# Patient Record
Sex: Male | Born: 1965 | Race: White | Hispanic: No | Marital: Single | Smoking: Never smoker
Health system: Southern US, Community
[De-identification: ages and names within clinical notes are randomized; demographics above are authoritative.]

## PROBLEM LIST (undated history)

## (undated) DIAGNOSIS — I10 Essential (primary) hypertension: Secondary | ICD-10-CM

## (undated) DIAGNOSIS — K4090 Unilateral inguinal hernia, without obstruction or gangrene, not specified as recurrent: Secondary | ICD-10-CM

## (undated) DIAGNOSIS — R7303 Prediabetes: Secondary | ICD-10-CM

## (undated) DIAGNOSIS — K5790 Diverticulosis of intestine, part unspecified, without perforation or abscess without bleeding: Secondary | ICD-10-CM

## (undated) HISTORY — PX: BACK SURGERY: SHX140

---

## 2005-12-03 ENCOUNTER — Emergency Department (HOSPITAL_COMMUNITY): Admission: EM | Admit: 2005-12-03 | Discharge: 2005-12-03 | Payer: Self-pay | Admitting: Emergency Medicine

## 2005-12-16 ENCOUNTER — Ambulatory Visit: Payer: Self-pay | Admitting: Internal Medicine

## 2008-01-09 ENCOUNTER — Ambulatory Visit: Payer: Self-pay | Admitting: Internal Medicine

## 2009-03-13 ENCOUNTER — Ambulatory Visit: Payer: Self-pay | Admitting: Gastroenterology

## 2012-04-15 ENCOUNTER — Ambulatory Visit: Payer: Self-pay | Admitting: Internal Medicine

## 2014-11-28 ENCOUNTER — Emergency Department: Payer: BLUE CROSS/BLUE SHIELD

## 2014-11-28 ENCOUNTER — Encounter: Payer: Self-pay | Admitting: Emergency Medicine

## 2014-11-28 ENCOUNTER — Ambulatory Visit (INDEPENDENT_AMBULATORY_CARE_PROVIDER_SITE_OTHER)
Admission: EM | Admit: 2014-11-28 | Discharge: 2014-11-28 | Disposition: A | Discharge status: Home / Self Care | Payer: BLUE CROSS/BLUE SHIELD | Source: Home / Self Care | Attending: Family Medicine | Admitting: Family Medicine

## 2014-11-28 ENCOUNTER — Emergency Department
Admission: EM | Admit: 2014-11-28 | Discharge: 2014-11-28 | Disposition: A | Discharge status: Home / Self Care | Payer: BLUE CROSS/BLUE SHIELD | Attending: Emergency Medicine | Admitting: Emergency Medicine

## 2014-11-28 DIAGNOSIS — N289 Disorder of kidney and ureter, unspecified: Secondary | ICD-10-CM

## 2014-11-28 DIAGNOSIS — R109 Unspecified abdominal pain: Secondary | ICD-10-CM

## 2014-11-28 DIAGNOSIS — I10 Essential (primary) hypertension: Secondary | ICD-10-CM | POA: Diagnosis not present

## 2014-11-28 DIAGNOSIS — K5732 Diverticulitis of large intestine without perforation or abscess without bleeding: Secondary | ICD-10-CM | POA: Insufficient documentation

## 2014-11-28 DIAGNOSIS — R1032 Left lower quadrant pain: Secondary | ICD-10-CM | POA: Diagnosis present

## 2014-11-28 DIAGNOSIS — Z880 Allergy status to penicillin: Secondary | ICD-10-CM | POA: Insufficient documentation

## 2014-11-28 DIAGNOSIS — N200 Calculus of kidney: Secondary | ICD-10-CM | POA: Diagnosis not present

## 2014-11-28 HISTORY — DX: Essential (primary) hypertension: I10

## 2014-11-28 HISTORY — DX: Diverticulosis of intestine, part unspecified, without perforation or abscess without bleeding: K57.90

## 2014-11-28 LAB — COMPREHENSIVE METABOLIC PANEL
ALT: 19 U/L (ref 17–63)
AST: 19 U/L (ref 15–41)
Albumin: 4.3 g/dL (ref 3.5–5.0)
Alkaline Phosphatase: 72 U/L (ref 38–126)
Anion gap: 10 (ref 5–15)
BUN: 34 mg/dL — ABNORMAL HIGH (ref 6–20)
CO2: 31 mmol/L (ref 22–32)
Calcium: 9.9 mg/dL (ref 8.9–10.3)
Chloride: 95 mmol/L — ABNORMAL LOW (ref 101–111)
Creatinine, Ser: 1.93 mg/dL — ABNORMAL HIGH (ref 0.61–1.24)
GFR calc Af Amer: 46 mL/min — ABNORMAL LOW (ref >60)
GFR calc non Af Amer: 40 mL/min — ABNORMAL LOW (ref >60)
Glucose, Bld: 109 mg/dL — ABNORMAL HIGH (ref 65–99)
Potassium: 5.3 mmol/L — ABNORMAL HIGH (ref 3.5–5.1)
Sodium: 136 mmol/L (ref 135–145)
Total Bilirubin: 1.8 mg/dL — ABNORMAL HIGH (ref 0.3–1.2)
Total Protein: 8.2 g/dL — ABNORMAL HIGH (ref 6.5–8.1)

## 2014-11-28 LAB — CBC WITH DIFFERENTIAL/PLATELET
Basophils Absolute: 0.1 10*3/uL (ref 0–0.1)
Basophils Relative: 1 %
Eosinophils Absolute: 0.2 10*3/uL (ref 0–0.7)
Eosinophils Relative: 2 %
HCT: 46.4 % (ref 40.0–52.0)
Hemoglobin: 15.9 g/dL (ref 13.0–18.0)
Lymphocytes Relative: 15 %
Lymphs Abs: 1.6 10*3/uL (ref 1.0–3.6)
MCH: 29.2 pg (ref 26.0–34.0)
MCHC: 34.3 g/dL (ref 32.0–36.0)
MCV: 85.3 fL (ref 80.0–100.0)
Monocytes Absolute: 1.4 10*3/uL — ABNORMAL HIGH (ref 0.2–1.0)
Monocytes Relative: 13 %
Neutro Abs: 7.6 10*3/uL — ABNORMAL HIGH (ref 1.4–6.5)
Neutrophils Relative %: 69 %
Platelets: 244 10*3/uL (ref 150–440)
RBC: 5.44 MIL/uL (ref 4.40–5.90)
RDW: 15.4 % — ABNORMAL HIGH (ref 11.5–14.5)
WBC: 10.9 10*3/uL — ABNORMAL HIGH (ref 3.8–10.6)

## 2014-11-28 LAB — URINALYSIS COMPLETE WITH MICROSCOPIC (ARMC ONLY)
Glucose, UA: NEGATIVE mg/dL
Hgb urine dipstick: NEGATIVE
Leukocytes, UA: NEGATIVE
Nitrite: POSITIVE — AB
Protein, ur: 30 mg/dL — AB
Specific Gravity, Urine: 1.03 (ref 1.005–1.030)
pH: 5 (ref 5.0–8.0)

## 2014-11-28 MED ORDER — SODIUM CHLORIDE 0.9 % IV BOLUS (SEPSIS)
1000.0000 mL | Freq: Once | INTRAVENOUS | Status: AC
  Administered 2014-11-28: 1000 mL via INTRAVENOUS

## 2014-11-28 MED ORDER — METRONIDAZOLE 500 MG PO TABS
500.0000 mg | ORAL_TABLET | Freq: Once | ORAL | Status: AC
  Administered 2014-11-28: 500 mg via ORAL

## 2014-11-28 MED ORDER — METRONIDAZOLE 500 MG PO TABS
ORAL_TABLET | ORAL | Status: AC
  Administered 2014-11-28: 500 mg via ORAL
  Filled 2014-11-28: qty 1

## 2014-11-28 MED ORDER — METRONIDAZOLE 500 MG PO TABS: 500.0000 mg | ORAL_TABLET | Freq: Two times a day (BID) | ORAL | Status: DC

## 2014-11-28 MED ORDER — CIPROFLOXACIN IN D5W 400 MG/200ML IV SOLN
400.0000 mg | Freq: Once | INTRAVENOUS | Status: AC
  Administered 2014-11-28: 400 mg via INTRAVENOUS

## 2014-11-28 MED ORDER — CIPROFLOXACIN HCL 500 MG PO TABS: 500.0000 mg | ORAL_TABLET | Freq: Two times a day (BID) | ORAL | Status: AC

## 2014-11-28 MED ORDER — OXYCODONE-ACETAMINOPHEN 5-325 MG PO TABS: 1.0000 | ORAL_TABLET | Freq: Four times a day (QID) | ORAL | Status: DC | PRN

## 2014-11-28 MED ORDER — CIPROFLOXACIN IN D5W 400 MG/200ML IV SOLN
INTRAVENOUS | Status: AC
  Administered 2014-11-28: 400 mg via INTRAVENOUS
  Filled 2014-11-28: qty 200

## 2014-11-28 NOTE — ED Provider Notes (Signed)
[facility] Emergency Department Provider Note  ____________________________________________  Time seen: 1805  I have reviewed the triage vital signs and the nursing notes.   HISTORY  Chief Complaint Abdominal Pain     HPI Curtis Bonilla is a 49 y.o. male who is been experiencing pain in his left abdomen since this past Sunday. He does have a history of diverticulitis in the past. He had some leftover ciprofloxacin in the house and he began taking that on Sunday. He is not feeling any relief 3 days later.  With this pain in his left lower quadrant, he went to med and urgent care today. Blood tests and urinalysis was reviewed. CT scan was not available at the [facility] urgent care and the patient has been sent to the emergency department for further evaluation.  Patient reports that his pain has been moderate or more severe at times. It hurts more when he is being jarred, as in when his car had some bump or if he steps down hard. He has not been tolerating food for a well and has eaten very little over the past 3-4 days. He reports his urine has been a bright RN she color. He has been drinking plenty of fluids, but despite this he is not urinating much here in the emergency department after 1 L via the IV and 1 L of oral contrast.    Past Medical History  Diagnosis Date  . Hypertension   . Diverticular disease     There are no active problems to display for this patient.   Past Surgical History  Procedure Laterality Date  . Back surgery      Current Outpatient Rx  Name  Route  Sig  Dispense  Refill  . ciprofloxacin (CIPRO) 500 MG tablet   Oral   Take 1 tablet (500 mg total) by mouth 2 (two) times daily.   14 tablet   0   . metroNIDAZOLE (FLAGYL) 500 MG tablet   Oral   Take 1 tablet (500 mg total) by mouth 2 (two) times daily.   14 tablet   0   . oxyCODONE-acetaminophen (ROXICET) 5-325 MG per tablet   Oral   Take 1 tablet by mouth every 6  (six) hours as needed.   12 tablet   0     Allergies Penicillins  No family history on file.  Social History History  Substance Use Topics  . Smoking status: Never Smoker   . Smokeless tobacco: Not on file  . Alcohol Use: Yes    Review of Systems  Constitutional: Negative for fever. ENT: Negative for sore throat. Cardiovascular: Negative for chest pain. Respiratory: Negative for shortness of breath. Gastrointestinal: Abdominal pain. See history of present illness. Genitourinary: Negative for dysuria. Musculoskeletal: No myalgias or injuries. Skin: Negative for rash. Neurological: Negative for headaches   10-point ROS otherwise negative.  ____________________________________________   PHYSICAL EXAM:  VITAL SIGNS: ED Triage Vitals  Enc Vitals Group     BP 11/28/14 1612 119/81 mmHg     Pulse Rate 11/28/14 1612 101     Resp 11/28/14 1612 18     Temp 11/28/14 1612 98.4 F (36.9 C)     Temp Source 11/28/14 1612 Oral     SpO2 11/28/14 1612 100 %     Weight 11/28/14 1612 270 lb (122.471 kg)     Height 11/28/14 1612 6' (1.829 m)     Head Cir --      Peak Flow --  Pain Score 11/28/14 1613 6     Pain Loc --      Pain Edu? --      Excl. in GC? --     Constitutional: Alert and oriented. Patient appears a little uncomfortable due to abdominal pain but otherwise no acute distress.Marland Kitchen. ENT   Head: Normocephalic and atraumatic.   Nose: No congestion/rhinnorhea.   Mouth/Throat: Mucous membranes are moist. Cardiovascular: Normal rate, regular rhythm, no murmur noted Respiratory:  Normal respiratory effort, no tachypnea.    Breath sounds are clear and equal bilaterally.  Gastrointestinal: Soft. Patient has tenderness in the left lower quadrant. He also has mild tenderness noted in the right abdomen that refers to the left. He has minimal left CVA tenderness. "I can feel that".  Back: No muscle spasm, no tenderness Musculoskeletal: No deformity noted.  Nontender with normal range of motion in all extremities.  No noted edema. Neurologic:  Normal speech and language. No gross focal neurologic deficits are appreciated.  Skin:  Skin is warm, dry. No rash noted. Psychiatric: Mood and affect are normal. Speech and behavior are normal.  ____________________________________________    LABS (pertinent positives/negatives)  Labs from med urgent care earlier today have been reviewed. He has a normal white blood cell count, mild elevation in BUN and mild elevation and potassium at 5.3.  Patient has normal LFTs, but slight elevation in bilirubin at 1.8.  Urinalysis shows 6-30 red blood cells with no white blood cells but positive for nitrites. 1+ ketones and 3+ bilirubin ____________________________________________  RADIOLOGY  CT scan abdomen pelvis: IMPRESSION: 1. Moderate recurrent uncomplicated descending/sigmoid diverticulitis. No evidence of abscess or obstruction. 2. Hepatic steatosis.  ____________________________________________ ____________________________________________   INITIAL IMPRESSION / ASSESSMENT AND PLAN / ED COURSE  Pertinent labs & imaging results that were available during my care of the patient were reviewed by me and considered in my medical decision making (see chart for details).  Patient with signs and symptoms consistent with diverticulitis. He has been on Cipro for the past 3 days without benefit. CT scan results are pending.  ----------------------------------------- 6:28 PM on 11/28/2014 -----------------------------------------  CT results show patient does have recurrent, uncomplicated, descending and sigmoid diverticulitis. We will treat him with an additional liter of normal saline IV along with a dose of Cipro IV and a dose of metronidazole by mouth. We will continue with metronidazole as well as Cipro and prescribed some pain medication for him. We've advised the patient follow-up with his regular  primary physician.  ____________________________________________   FINAL CLINICAL IMPRESSION(S) / ED DIAGNOSES  Final diagnoses:  Abdominal pain in male  Diverticulitis large intestine w/o perforation or abscess w/o bleeding      Darien Ramusavid W Campbell Kray, MD 11/28/14 1930

## 2014-11-28 NOTE — Discharge Instructions (Signed)
Your CT scan does show signs of diverticulitis in the descending and sigmoid colon. Take Cipro and metronidazole as prescribed. He may use Percocet if needed for pain control. Do take a stool softener during this period of treatment. Follow-up with your regular doctor later next week. If your pain worsens, if you have fever, or if you have other urgent concerns, return to the emergency department.  Diverticulitis Diverticulitis is inflammation or infection of small pouches in your colon that form when you have a condition called diverticulosis. The pouches in your colon are called diverticula. Your colon, or large intestine, is where water is absorbed and stool is formed. Complications of diverticulitis can include:  Bleeding.  Severe infection.  Severe pain.  Perforation of your colon.  Obstruction of your colon. CAUSES  Diverticulitis is caused by bacteria. Diverticulitis happens when stool becomes trapped in diverticula. This allows bacteria to grow in the diverticula, which can lead to inflammation and infection. RISK FACTORS People with diverticulosis are at risk for diverticulitis. Eating a diet that does not include enough fiber from fruits and vegetables may make diverticulitis more likely to develop. SYMPTOMS  Symptoms of diverticulitis may include:  Abdominal pain and tenderness. The pain is normally located on the left side of the abdomen, but may occur in other areas.  Fever and chills.  Bloating.  Cramping.  Nausea.  Vomiting.  Constipation.  Diarrhea.  Blood in your stool. DIAGNOSIS  Your health care provider will ask you about your medical history and do a physical exam. You may need to have tests done because many medical conditions can cause the same symptoms as diverticulitis. Tests may include:  Blood tests.  Urine tests.  Imaging tests of the abdomen, including X-rays and CT scans. When your condition is under control, your health care provider may  recommend that you have a colonoscopy. A colonoscopy can show how severe your diverticula are and whether something else is causing your symptoms. TREATMENT  Most cases of diverticulitis are mild and can be treated at home. Treatment may include:  Taking over-the-counter pain medicines.  Following a clear liquid diet.  Taking antibiotic medicines by mouth for 7-10 days. More severe cases may be treated at a hospital. Treatment may include:  Not eating or drinking.  Taking prescription pain medicine.  Receiving antibiotic medicines through an IV tube.  Receiving fluids and nutrition through an IV tube.  Surgery. HOME CARE INSTRUCTIONS   Follow your health care provider's instructions carefully.  Follow a full liquid diet or other diet as directed by your health care provider. After your symptoms improve, your health care provider may tell you to change your diet. He or she may recommend you eat a high-fiber diet. Fruits and vegetables are good sources of fiber. Fiber makes it easier to pass stool.  Take fiber supplements or probiotics as directed by your health care provider.  Only take medicines as directed by your health care provider.  Keep all your follow-up appointments. SEEK MEDICAL CARE IF:   Your pain does not improve.  You have a hard time eating food.  Your bowel movements do not return to normal. SEEK IMMEDIATE MEDICAL CARE IF:   Your pain becomes worse.  Your symptoms do not get better.  Your symptoms suddenly get worse.  You have a fever.  You have repeated vomiting.  You have bloody or black, tarry stools. MAKE SURE YOU:   Understand these instructions.  Will watch your condition.  Will get help right  away if you are not doing well or get worse. Document Released: 02/25/2005 Document Revised: 05/23/2013 Document Reviewed: 04/12/2013 [facility] Patient Information 2015 [employer] [place], [place]. This information is not intended to replace advice given to you  by your health care provider. Make sure you discuss any questions you have with your health care provider.

## 2014-11-28 NOTE — ED Notes (Signed)
Pt presents with LLQ abd pain with some fever. Also reports small area to left lower abd that resembles a bruise. Pt reports no known injury. Pt also reports pain upon urination started last night.

## 2014-11-28 NOTE — ED Notes (Signed)
[facility] pain since Saturday, states pain is getting worse each day, denies any urinary symptoms but states his urine has been dark, denies any n,v, hx of diverticulits

## 2014-11-28 NOTE — ED Provider Notes (Addendum)
CSN: [id number]     Arrival date & time 11/28/14  1250 History   First MD Initiated Contact with Patient 11/28/14 1401     Chief Complaint  Patient presents with  . Abdominal Pain   (Consider location/radiation/quality/duration/timing/severity/associated sxs/prior Treatment) HPI  This a 49 year old gentleman who presents with left lower quadrant pain radiating to his groin since Saturday. He states that it is worsening. He's had a tactile fever and he states bright orange urine. His appetite is been markedly decreased since Saturday. He denies any diarrhea or constipation and his last normal BM was on Monday night. At that time he had no blood or mucus in the stool. He denies any nausea or vomiting. He has a history of diverticulitis and was treated about 1 year ago with Cipro. He had some left over since he didn't finish his medicine after his pain subsided and he has been using that since Sunday without any benefit.The pain is described as sharp and he tends to want to localize it in the left lower quadrant.  Past Medical History  Diagnosis Date  . Hypertension   . Diverticular disease    Past Surgical History  Procedure Laterality Date  . Back surgery     No family history on file. History  Substance Use Topics  . Smoking status: Never Smoker   . Smokeless tobacco: Not on file  . Alcohol Use: Yes    Review of Systems  Constitutional: Positive for fever.  Gastrointestinal: Positive for abdominal pain.  All other systems reviewed and are negative.   Allergies  Penicillins  Home Medications   Prior to Admission medications   Not on File   BP 110/77 mmHg  Pulse 92  Temp(Src) 98.3 F (36.8 C) (Oral)  Resp 18  Ht 6' (1.829 m)  Wt 270 lb (122.471 kg)  BMI 36.61 kg/m2  SpO2 99% Physical Exam  Constitutional: He is oriented to person, place, and time. He appears well-developed and well-nourished.  HENT:  Head: Normocephalic and atraumatic.  Eyes: EOM are normal.  Pupils are equal, round, and reactive to light.  Neck: Normal range of motion. Neck supple.  Pulmonary/Chest: Effort normal and breath sounds normal. No respiratory distress. He has no wheezes. He has no rales. He exhibits no tenderness.  Abdominal: Soft.  Examination abdomen shows hypotonic but bowel sounds present. Patient has some guarding to palpation of the left lower quadrant. The abdomen is soft. There is no  rebound. Next the tenderness is in the left lower quadrant he states his pain is the worst. There is mild left CVA tenderness present. He has tenderness over the right lower quadrant but not nearly as severe. No masses are palpable.  Neurological: He is alert and oriented to person, place, and time. He has normal reflexes.  Skin: Skin is warm and dry.  Psychiatric: He has a normal mood and affect. His behavior is normal. Judgment and thought content normal.    ED Course  Procedures (including critical care time) Labs Review Labs Reviewed  CBC WITH DIFFERENTIAL/PLATELET - Abnormal; Notable for the following:    WBC 10.9 (*)    RDW 15.4 (*)    Neutro Abs 7.6 (*)    Monocytes Absolute 1.4 (*)    All other components within normal limits  COMPREHENSIVE METABOLIC PANEL - Abnormal; Notable for the following:    Potassium 5.3 (*)    Chloride 95 (*)    Glucose, Bld 109 (*)    BUN 34 (*)  Creatinine, Ser 1.93 (*)    Total Protein 8.2 (*)    Total Bilirubin 1.8 (*)    GFR calc non Af Amer 40 (*)    GFR calc Af Amer 46 (*)    All other components within normal limits  URINALYSIS COMPLETEWITH MICROSCOPIC ([facility] ONLY) - Abnormal; Notable for the following:    Color, Urine AMBER (*)    APPearance HAZY (*)    Bilirubin Urine 3+ (*)    Ketones, ur 1+ (*)    Protein, ur 30 (*)    Nitrite POSITIVE (*)    Bacteria, UA FEW (*)    Squamous Epithelial / LPF 0-5 (*)    All other components within normal limits    Imaging Review No results found.   MDM   1. Kidney stone on left  side   2. Acute renal insufficiency    I discussed with the patient the results of his laboratory my examination. His urinalysis is consistent with a possible kidney stone with some renal insufficiency with elevated creatinine and potassium. We do not have CT is made the facility and will send him to the emergency department at [facility] for evaluation and treatment. This was discussed with the patient. He is agreeable to this. He is going to transport himself and contact his family on the way. I spoke with the charge nurse at [facility] to alert them of his coming.   Lutricia Feil, PA-C 11/28/14 1549  Lutricia Feil, PA-C 11/28/14 2134

## 2015-07-22 ENCOUNTER — Other Ambulatory Visit: Payer: Self-pay | Admitting: Orthopedic Surgery

## 2015-07-22 DIAGNOSIS — M5442 Lumbago with sciatica, left side: Secondary | ICD-10-CM

## 2015-08-09 ENCOUNTER — Ambulatory Visit
Admission: RE | Admit: 2015-08-09 | Discharge: 2015-08-09 | Disposition: A | Discharge status: Home / Self Care | Payer: BLUE CROSS/BLUE SHIELD | Source: Ambulatory Visit | Attending: Orthopedic Surgery | Admitting: Orthopedic Surgery

## 2015-08-09 DIAGNOSIS — M5126 Other intervertebral disc displacement, lumbar region: Secondary | ICD-10-CM | POA: Insufficient documentation

## 2015-08-09 DIAGNOSIS — M2578 Osteophyte, vertebrae: Secondary | ICD-10-CM | POA: Insufficient documentation

## 2015-08-09 DIAGNOSIS — M5442 Lumbago with sciatica, left side: Secondary | ICD-10-CM

## 2016-08-04 IMAGING — CT CT ABD-PELV W/O CM
1 of 2 series · 15 of 32 positions shown, 19 images · non-contrast
Comparison: Abdominal pelvic CT 01/17/2009.

CLINICAL DATA: Left lower quadrant abdominal pain with fever of
unknown duration. Possible left lower abdominal bruising. No known
injury. Initial encounter.

EXAM:
CT ABDOMEN AND PELVIS WITHOUT CONTRAST
TECHNIQUE: Multidetector CT imaging of the abdomen and pelvis was performed
following the standard protocol without IV contrast.

[Series 2: routine abd pel without · axial · non-contrast · 0.91mm/px · z∈[-1136,-621]mm · 15 of 113 slices shown, 19 images]
[im 5/113  soft-tissue]
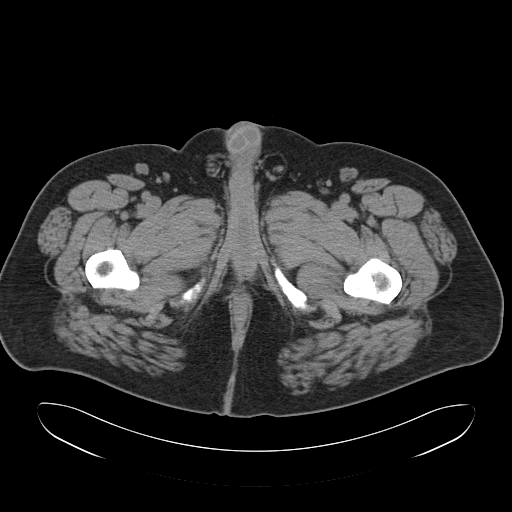
[im 5/113  bone]
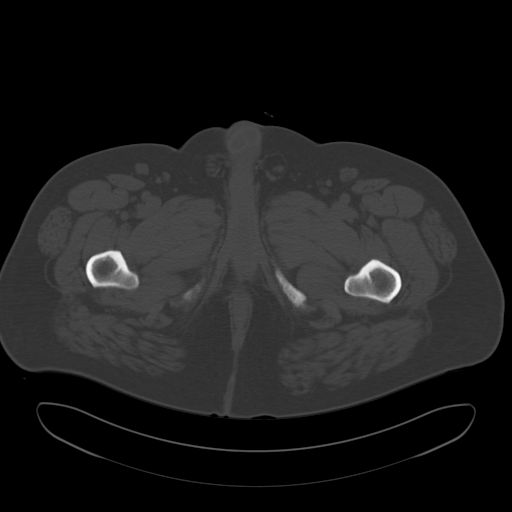
[im 15/113  soft-tissue]
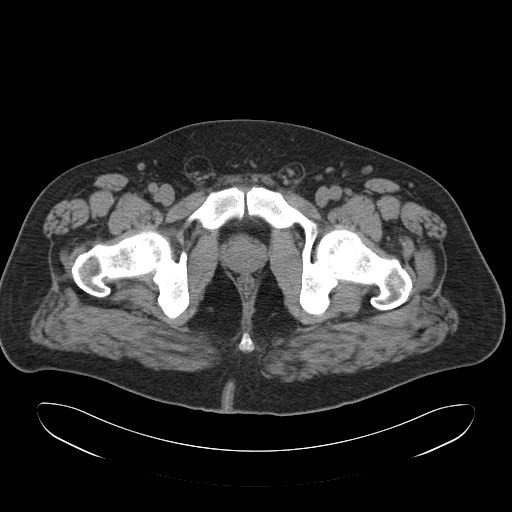
[im 25/113  soft-tissue]
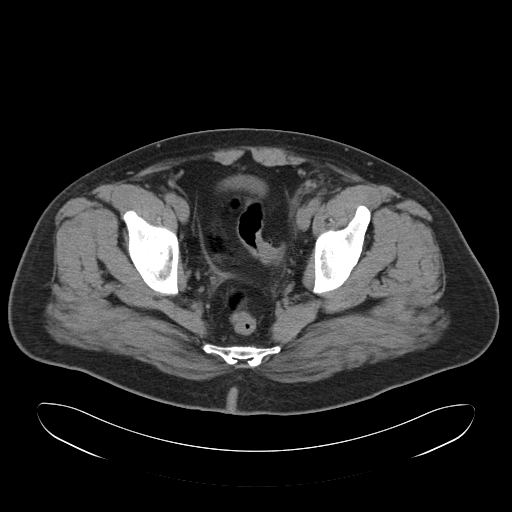
[im 30/113  soft-tissue]
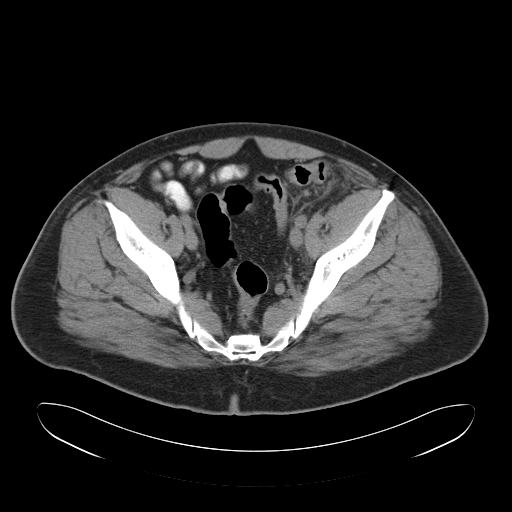
[im 39/113  soft-tissue]
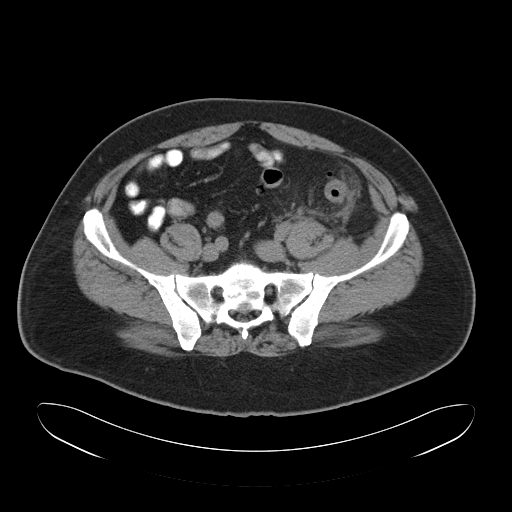
[im 49/113  soft-tissue]
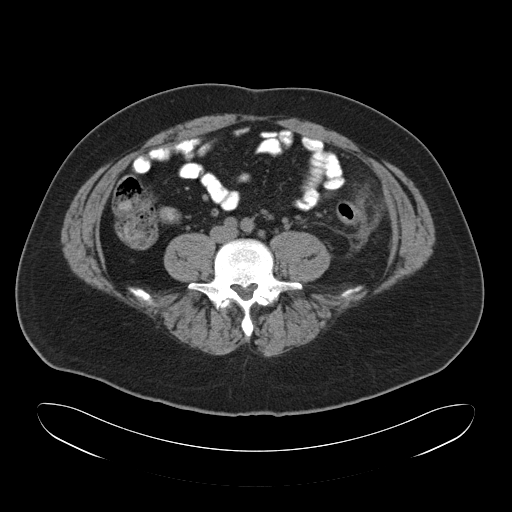
[im 59/113  soft-tissue]
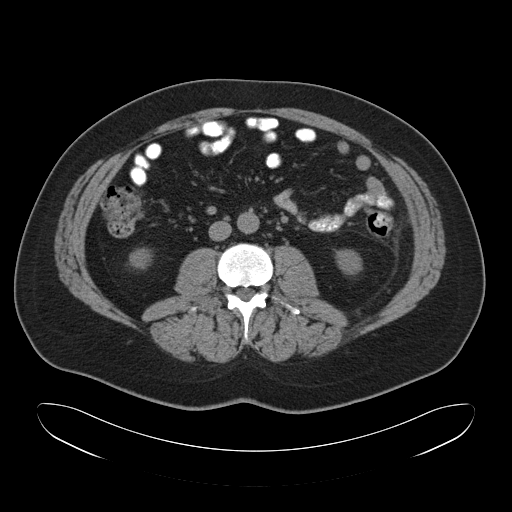
[im 64/113  soft-tissue]
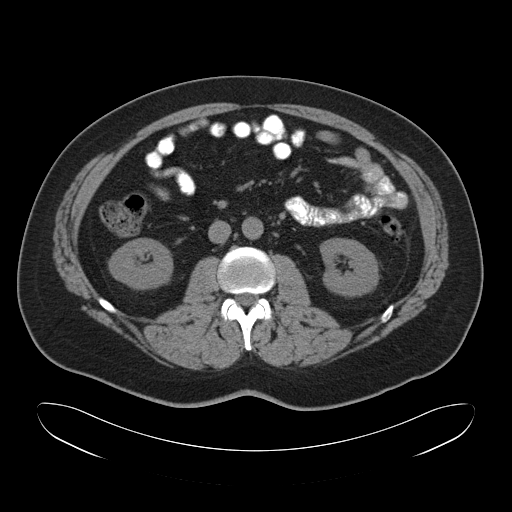
[im 74/113  soft-tissue]
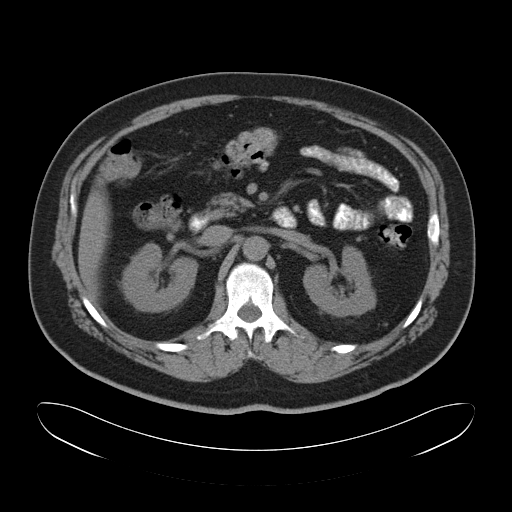
[im 74/113  bone]
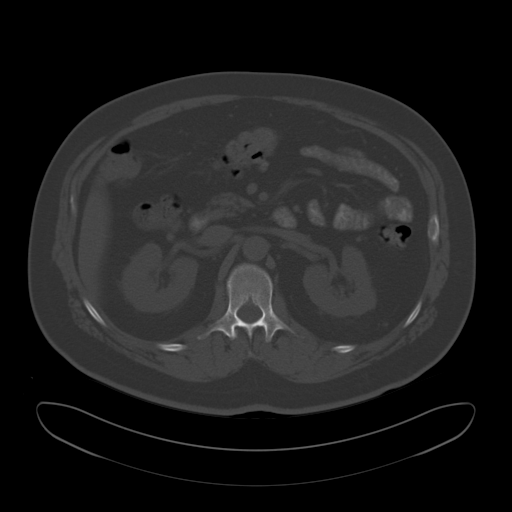
[im 83/113  soft-tissue]
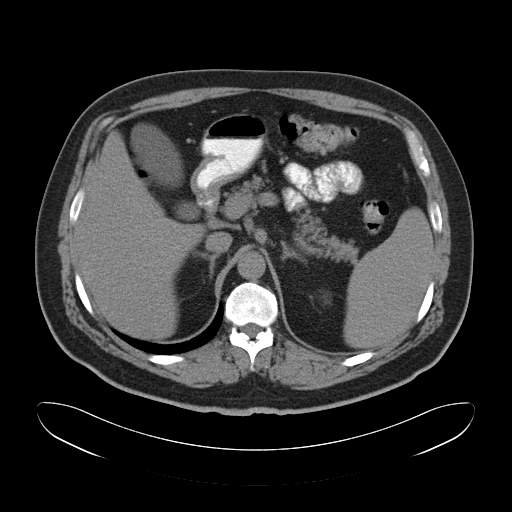
[im 88/113  soft-tissue]
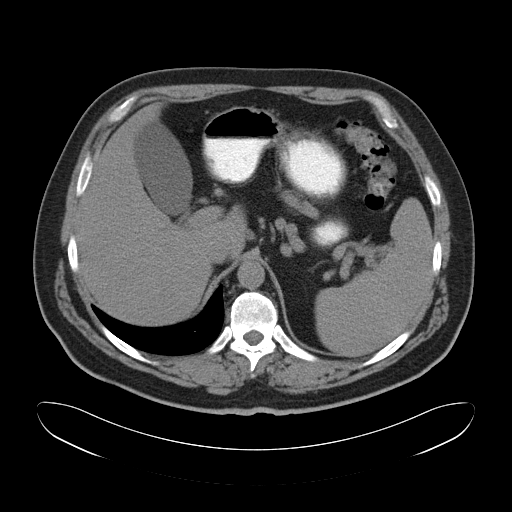
[im 93/113  lung]
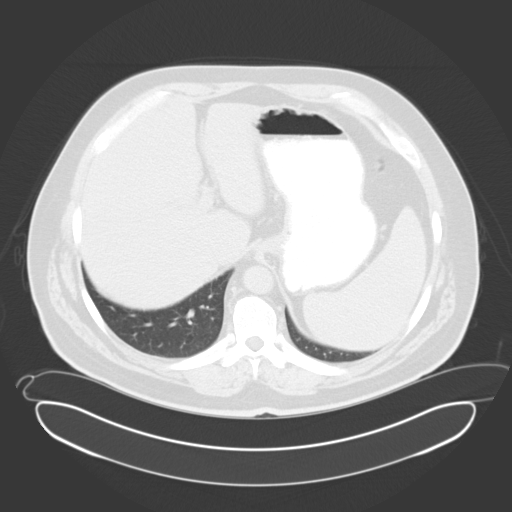
[im 98/113  soft-tissue]
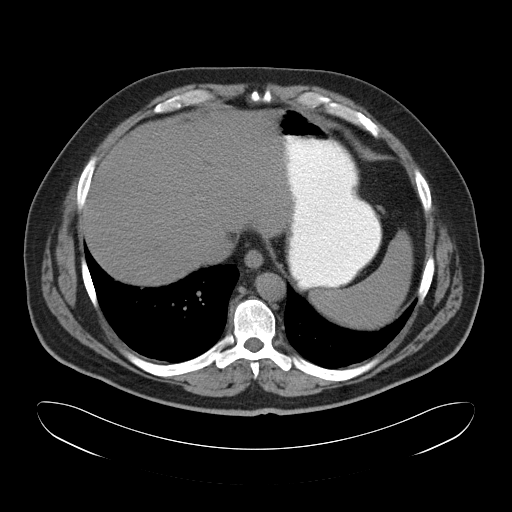
[im 98/113  lung]
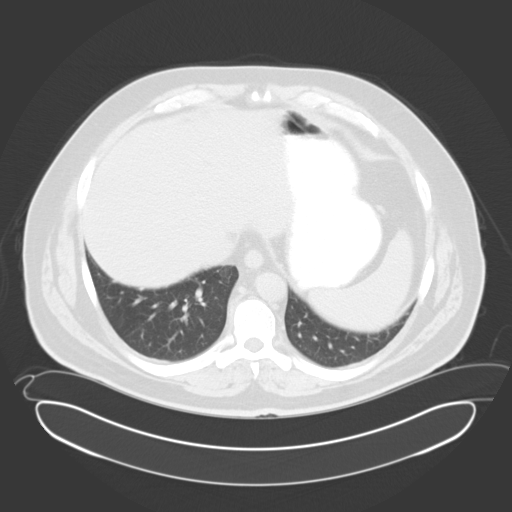
[im 103/113  lung]
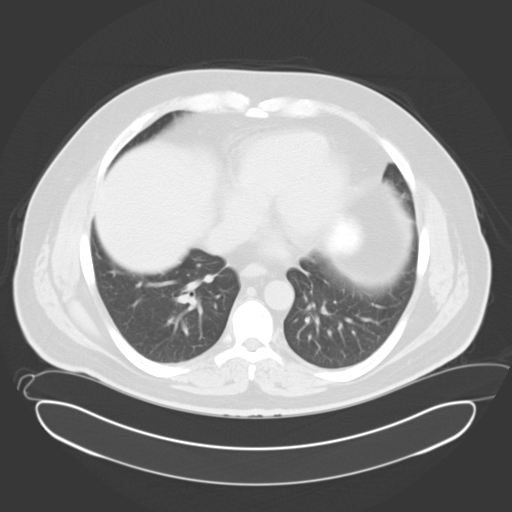
[im 108/113  soft-tissue]
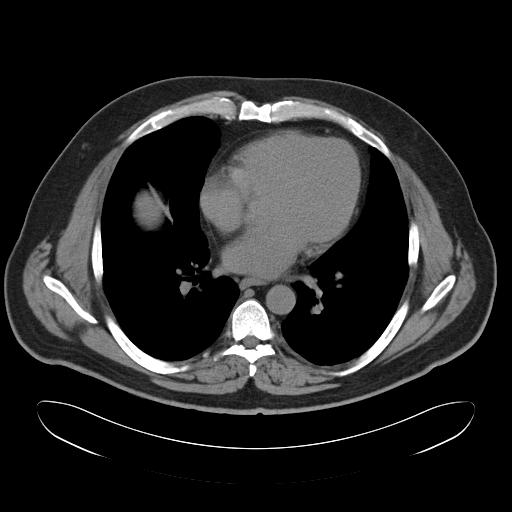
[im 108/113  lung]
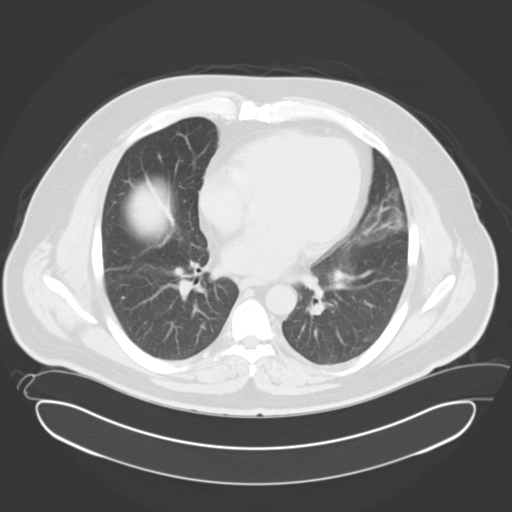

[15 of 32 positions shown; findings below may reference images not displayed]

FINDINGS: Lower chest: Clear lung bases. No significant pleural or pericardial
effusion.

Hepatobiliary: The liver demonstrates decreased density consistent
with steatosis. As evaluated in the noncontrast state, no focal
abnormalities identified. No evidence of gallstones, gallbladder
wall thickening or biliary dilatation.

Pancreas: Unremarkable. No pancreatic ductal dilatation or
surrounding inflammatory changes.

Spleen: Normal in size without focal abnormality.

Adrenals/Urinary Tract: Both adrenal glands appear normal.The
kidneys appear normal without evidence of urinary tract calculus,
suspicious lesion or hydronephrosis. No bladder abnormalities are
seen.

Stomach/Bowel: The stomach, small bowel, appendix and proximal colon
appear normal.There are diverticular changes throughout the distal
colon. There is irregular wall thickening of the distal descending
and proximal sigmoid colon with surrounding inflammation consistent
with diverticulitis. Compared with the prior study, this is slightly
worse and slightly more proximal in distribution. No evidence of
bowel perforation or obstruction.

Vascular/Lymphatic: There are no enlarged abdominal or pelvic lymph
nodes. No significant vascular findings on noncontrast imaging.

Reproductive: Unremarkable.

Other: No evidence of abdominal wall mass or hernia.

Musculoskeletal: No acute or significant osseous findings.
IMPRESSION: 1. Moderate recurrent uncomplicated descending/sigmoid
diverticulitis. No evidence of abscess or obstruction.
2. Hepatic steatosis.

## 2017-12-10 ENCOUNTER — Encounter: Payer: Self-pay | Admitting: Emergency Medicine

## 2017-12-10 ENCOUNTER — Emergency Department: Payer: BLUE CROSS/BLUE SHIELD

## 2017-12-10 ENCOUNTER — Emergency Department
Admission: EM | Admit: 2017-12-10 | Discharge: 2017-12-10 | Disposition: A | Discharge status: Home / Self Care | Payer: BLUE CROSS/BLUE SHIELD | Attending: Emergency Medicine | Admitting: Emergency Medicine

## 2017-12-10 ENCOUNTER — Other Ambulatory Visit: Payer: Self-pay

## 2017-12-10 DIAGNOSIS — I10 Essential (primary) hypertension: Secondary | ICD-10-CM | POA: Diagnosis not present

## 2017-12-10 DIAGNOSIS — Y929 Unspecified place or not applicable: Secondary | ICD-10-CM | POA: Diagnosis not present

## 2017-12-10 DIAGNOSIS — Z23 Encounter for immunization: Secondary | ICD-10-CM | POA: Diagnosis not present

## 2017-12-10 DIAGNOSIS — W293XXA Contact with powered garden and outdoor hand tools and machinery, initial encounter: Secondary | ICD-10-CM | POA: Insufficient documentation

## 2017-12-10 DIAGNOSIS — Y999 Unspecified external cause status: Secondary | ICD-10-CM | POA: Diagnosis not present

## 2017-12-10 DIAGNOSIS — S91012A Laceration without foreign body, left ankle, initial encounter: Secondary | ICD-10-CM

## 2017-12-10 DIAGNOSIS — Y939 Activity, unspecified: Secondary | ICD-10-CM | POA: Diagnosis not present

## 2017-12-10 MED ORDER — CLINDAMYCIN HCL 300 MG PO CAPS: 300.0000 mg | ORAL_CAPSULE | Freq: Three times a day (TID) | ORAL | 0 refills | Status: AC

## 2017-12-10 MED ORDER — LIDOCAINE HCL (PF) 1 % IJ SOLN
INTRAMUSCULAR | Status: AC
  Administered 2017-12-10: 20 mL
  Filled 2017-12-10: qty 25

## 2017-12-10 MED ORDER — IBUPROFEN 600 MG PO TABS: 600.0000 mg | ORAL_TABLET | Freq: Four times a day (QID) | ORAL | 0 refills | Status: DC | PRN

## 2017-12-10 MED ORDER — TETANUS-DIPHTH-ACELL PERTUSSIS 5-2.5-18.5 LF-MCG/0.5 IM SUSP
0.5000 mL | Freq: Once | INTRAMUSCULAR | Status: AC
  Administered 2017-12-10: 0.5 mL via INTRAMUSCULAR
  Filled 2017-12-10: qty 0.5

## 2017-12-10 MED ORDER — OXYCODONE-ACETAMINOPHEN 5-325 MG PO TABS: 1.0000 | ORAL_TABLET | ORAL | 0 refills | Status: AC | PRN

## 2017-12-10 NOTE — ED Notes (Signed)
Wound to left ankle cleansed with saline and sterile dressing applied  crutches offered and instructions given

## 2017-12-10 NOTE — ED Triage Notes (Signed)
Pt to ED via POV for laceration to the left foot. Pt states that he was using a chain saw and the saw slipped and he caught his foot. Bleeding is controlled at this time. Bandage placed on pts foot.

## 2017-12-10 NOTE — ED Notes (Signed)
See triage note states he was using a chain saw and it jumped back on his ankle  He has several lacerations to left lateral ankle

## 2017-12-10 NOTE — ED Provider Notes (Signed)
[facility] Emergency Department Provider Note  ____________________________________________  Time seen: Approximately 1:34 PM  I have reviewed the triage vital signs and the nursing notes.   HISTORY  Chief Complaint Laceration    HPI TAEVEON KEESLING is a 52 y.o. male that presents emergency department for evaluation of laceration to left ankle after a chainsaw fell. He does not think that anything is broken.  He is not having any difficulty moving ankle. He is unsure of last tetanus shot.    Past Medical History:  Diagnosis Date  . Diverticular disease   . Hypertension     There are no active problems to display for this patient.   Past Surgical History:  Procedure Laterality Date  . BACK SURGERY      Prior to Admission medications   Medication Sig Start Date End Date Taking? Authorizing Provider  clindamycin (CLEOCIN) 300 MG capsule Take 1 capsule (300 mg total) by mouth 3 (three) times daily for 10 days. 12/10/17 12/20/17  Enid Derry, PA-C  ibuprofen (ADVIL,MOTRIN) 600 MG tablet Take 1 tablet (600 mg total) by mouth every 6 (six) hours as needed. 12/10/17   Enid Derry, PA-C  metroNIDAZOLE (FLAGYL) 500 MG tablet Take 1 tablet (500 mg total) by mouth 2 (two) times daily. 11/28/14   Darien Ramus, MD  oxyCODONE-acetaminophen (PERCOCET) 5-325 MG tablet Take 1 tablet by mouth every 4 (four) hours as needed for severe pain. 12/10/17 12/10/18  Enid Derry, PA-C    Allergies Penicillins  No family history on file.  Social History Social History   Tobacco Use  . Smoking status: Never Smoker  . Smokeless tobacco: Never Used  Substance Use Topics  . Alcohol use: Yes  . Drug use: Not on file     Review of Systems  Gastrointestinal:  No nausea, no vomiting.  Musculoskeletal: Negative for musculoskeletal pain. Skin: Negative for rash, ecchymosis.  Positive for laceration. Neurological: Negative for numbness or  tingling   ____________________________________________   PHYSICAL EXAM:  VITAL SIGNS: ED Triage Vitals  Enc Vitals Group     BP 12/10/17 1119 (!) 145/120     Pulse Rate 12/10/17 1119 (!) 106     Resp 12/10/17 1119 16     Temp 12/10/17 1119 97.7 F (36.5 C)     Temp Source 12/10/17 1119 Oral     SpO2 12/10/17 1119 97 %     Weight 12/10/17 1121 260 lb (117.9 kg)     Height 12/10/17 1121 6' (1.829 m)     Head Circumference --      Peak Flow --      Pain Score 12/10/17 1121 5     Pain Loc --      Pain Edu? --      Excl. in GC? --      Constitutional: Alert and oriented. Well appearing and in no acute distress. Eyes: Conjunctivae are normal. PERRL. EOMI. Head: Atraumatic. ENT:      Ears:      Nose: No congestion/rhinnorhea.      Mouth/Throat: Mucous membranes are moist.  Neck: No stridor.   Cardiovascular: Normal rate, regular rhythm.  Good peripheral circulation. Respiratory: Normal respiratory effort without tachypnea or retractions. Lungs CTAB. Good air entry to the bases with no decreased or absent breath sounds. Musculoskeletal: Full range of motion to all extremities. No gross deformities appreciated. Full ROM of ankle. Minimal swelling. Neurologic:  Normal speech and language. No gross focal neurologic deficits are appreciated.  Skin:  Skin is warm, dry. Several 3 cm jagged shallow branching lacerations to medial malleolus. Psychiatric: Mood and affect are normal. Speech and behavior are normal. Patient exhibits appropriate insight and judgement.   ____________________________________________   LABS (all labs ordered are listed, but only abnormal results are displayed)  Labs Reviewed - No data to display ____________________________________________  EKG   ____________________________________________  RADIOLOGY Lexine Baton, personally viewed and evaluated these images (plain radiographs) as part of my medical decision making, as well as reviewing the  written report by the radiologist.  Dg Ankle Complete Left  Result Date: 12/10/2017 CLINICAL DATA:  Lacerations with chain saw. EXAM: LEFT ANKLE COMPLETE - 3+ VIEW COMPARISON:  None. FINDINGS: No foreign bodies identified. There is a bone island in the distal tibial diaphysis. The ankle mortise is intact. No fractures are seen. No other acute abnormalities. IMPRESSION: No foreign bodies or fractures noted. Electronically Signed   By: Gerome Sam III M.D   On: 12/10/2017 11:55    ____________________________________________    PROCEDURES  Procedure(s) performed:    Marland KitchenMarland KitchenLaceration Repair Date/Time: 12/10/2017 1:36 PM Performed by: Enid Derry, PA-C Authorized by: Enid Derry, PA-C   Consent:    Consent obtained:  Verbal   Consent given by:  Patient   Risks discussed:  Infection, pain, retained foreign body, poor cosmetic result and poor wound healing Anesthesia (see MAR for exact dosages):    Anesthesia method:  Local infiltration   Local anesthetic:  Lidocaine 1% w/o epi Laceration details:    Location:  Foot   Foot location:  L ankle Repair type:    Repair type:  Intermediate Pre-procedure details:    Preparation:  Patient was prepped and draped in usual sterile fashion Exploration:    Hemostasis achieved with:  Direct pressure   Wound exploration: entire depth of wound probed and visualized     Contaminated: no   Treatment:    Area cleansed with:  Saline   Amount of cleaning:  Extensive   Irrigation solution:  Sterile saline   Visualized foreign bodies/material removed: no   Subcutaneous repair:    Suture size:  5-0   Suture material:  Fast-absorbing gut   Suture technique:  Simple interrupted   Number of sutures:  8 Skin repair:    Repair method:  Sutures   Suture size:  4-0   Suture material:  Nylon   Suture technique:  Simple interrupted   Number of sutures:  34 Approximation:    Approximation:  Close Post-procedure details:    Dressing:  Sterile  dressing   Patient tolerance of procedure:  Tolerated well, no immediate complications      Medications  Tdap (BOOSTRIX) injection 0.5 mL (0.5 mLs Intramuscular Given 12/10/17 1208)  lidocaine (PF) (XYLOCAINE) 1 % injection (20 mLs  Given by Other 12/10/17 1419)     ____________________________________________   INITIAL IMPRESSION / ASSESSMENT AND PLAN / ED COURSE  Pertinent labs & imaging results that were available during my care of the patient were reviewed by me and considered in my medical decision making (see chart for details).  Review of the [place] CSRS was performed in accordance of the [facility] prior to dispensing any controlled drugs.   Patient's diagnosis is consistent with ankle laceration. Vital signs and exam are reassuring.  Ankle x-ray negative for acute bony abnormalities.  Patient is able to move his ankle in all directions.  Patient has several shallow lacerations with jagged edges to medial malleolus.  Laceration was  repaired with stitches with multiple layers of closure.  Ankle was wrapped.  Crutches were given.  Tetanus shot was updated.  He will be started on profalactic antibiotics. He is allergic to penicillins. Patient will be discharged home with prescriptions for ibuprofen, clindamycin, and an short course of percoct. Patient is to follow up with PCP as directed. Patient is given ED precautions to return to the ED for any worsening or new symptoms.    ____________________________________________  FINAL CLINICAL IMPRESSION(S) / ED DIAGNOSES  Final diagnoses:  Laceration of left ankle, initial encounter      NEW MEDICATIONS STARTED DURING THIS VISIT:  ED Discharge Orders        Ordered    oxyCODONE-acetaminophen (PERCOCET) 5-325 MG tablet  Every 4 hours PRN     12/10/17 1357    ibuprofen (ADVIL,MOTRIN) 600 MG tablet  Every 6 hours PRN     12/10/17 1357    clindamycin (CLEOCIN) 300 MG capsule  3 times daily     12/10/17 1357          This chart  was dictated using voice recognition software/Dragon. Despite best efforts to proofread, errors can occur which can change the meaning. Any change was purely unintentional.    Enid DerryWagner, Treshon Stannard, PA-C 12/10/17 1832    Jene EveryKinner, Robert, MD 12/13/17 1355

## 2018-06-15 ENCOUNTER — Other Ambulatory Visit: Payer: Self-pay | Admitting: Gastroenterology

## 2018-06-15 DIAGNOSIS — R112 Nausea with vomiting, unspecified: Secondary | ICD-10-CM

## 2018-06-16 ENCOUNTER — Other Ambulatory Visit
Admission: RE | Admit: 2018-06-16 | Discharge: 2018-06-16 | Disposition: A | Discharge status: Home / Self Care | Payer: PRIVATE HEALTH INSURANCE | Source: Other Acute Inpatient Hospital | Attending: Gastroenterology | Admitting: Gastroenterology

## 2018-06-16 DIAGNOSIS — R112 Nausea with vomiting, unspecified: Secondary | ICD-10-CM | POA: Insufficient documentation

## 2018-06-16 LAB — GASTROINTESTINAL PANEL BY PCR, STOOL (REPLACES STOOL CULTURE)

## 2018-06-16 LAB — C DIFFICILE QUICK SCREEN W PCR REFLEX
C DIFFICILE (CDIFF) INTERP: DETECTED
C DIFFICLE (CDIFF) ANTIGEN: POSITIVE — AB
C Diff toxin: POSITIVE — AB

## 2018-06-22 ENCOUNTER — Ambulatory Visit
Admission: RE | Admit: 2018-06-22 | Discharge: 2018-06-22 | Disposition: A | Discharge status: Home / Self Care | Payer: PRIVATE HEALTH INSURANCE | Source: Ambulatory Visit | Attending: Gastroenterology | Admitting: Gastroenterology

## 2018-06-22 DIAGNOSIS — R112 Nausea with vomiting, unspecified: Secondary | ICD-10-CM | POA: Insufficient documentation

## 2018-06-22 MED ORDER — IOPAMIDOL (ISOVUE-300) INJECTION 61%
100.0000 mL | Freq: Once | INTRAVENOUS | Status: AC | PRN
  Administered 2018-06-22: 100 mL via INTRAVENOUS

## 2021-08-13 ENCOUNTER — Ambulatory Visit: Payer: Self-pay | Admitting: Surgery

## 2021-08-13 NOTE — H&P (View-Only) (Signed)
Subjective: ? ?CC: Non-recurrent unilateral inguinal hernia without obstruction or gangrene [K40.90] ? ?HPI: ? Curtis Bonilla is a 56 y.o. male who was referred by Margarito Liner* for evaluation of above. Symptoms were first noted several years ago. Pain is sharp, radiating from the left groin, to the left testicle.  Associated with nothing, exacerbated by exertion, alleviated by laying down.  Lump is reducible.  ?  ?Past Medical History:  has a past medical history of Hypertension and Intervertebral disc disease. ? ?Past Surgical History:  ?Past Surgical History: ?Procedure Laterality Date ? Lumbar Spinal Surgery  1997 ? disc involvement ? COLONOSCOPY  01/03/2018 ? Tubular adenoma of the colon/Repeat 60yrs/TKT ? TONSILLECTOMY   ? ? ?Family History: family history includes Diverticulitis in his father; Heart failure in his paternal grandfather; High blood pressure (Hypertension) in his paternal grandfather; No Known Problems in his mother; Other in his paternal grandfather; Throat cancer in his maternal uncle. ? ?Social History:  reports that he has quit smoking. His smokeless tobacco use includes chew. He reports current alcohol use. He reports that he does not use drugs. ? ?Current Medications: has a current medication list which includes the following prescription(s): amlodipine, hydrochlorothiazide, lisinopril, and pantoprazole. ? ?Allergies:  ?Allergies as of 08/13/2021 - Reviewed 08/13/2021 ?Allergen Reaction Noted ? Penicillin g Rash 08/22/2013 ? ? ?ROS:  ?A 15 point review of systems was performed and pertinent positives and negatives noted in HPI ?  ?Objective: ?  ? ?BP (!) 135/90   Pulse 78   Ht 182.9 cm (6')   Wt (!) 121.1 kg (267 lb)   BMI 36.21 kg/m?  ? ?Constitutional :  Alert, cooperative, no distress ?Lymphatics/Throat:  Supple, no lymphadenopathy ?Respiratory:  clear to auscultation bilaterally ?Cardiovascular:  regular rate and rhythm ?Gastrointestinal: soft, non-tender; bowel sounds  normal; no masses,  no organomegaly. inguinal hernia noted.  moderate, reducible, no overlying skin changes and LEFT ?Musculoskeletal: Steady gait and movement ?Skin: Cool and moist, NO surgical scars ?Psychiatric: Normal affect, non-agitated, not confused ?  ?  ?LABS:  ?n/a  ? ?RADS: ?n/a ?Assessment: ? ?    ?Non-recurrent unilateral inguinal hernia without obstruction or gangrene [K40.90] ? ?Plan: ? ?  ?1. Non-recurrent unilateral inguinal hernia without obstruction or gangrene [K40.90]   ?Discussed the risk of surgery including recurrence, which can be up to 50% in the case of incisional or complex hernias, possible use of prosthetic materials (mesh) and the increased risk of mesh infxn if used, bleeding, chronic pain, post-op infxn, post-op SBO or ileus, and possible re-operation to address said risks. The risks of general anesthetic, if used, includes MI, CVA, sudden death or even reaction to anesthetic medications also discussed. Alternatives include continued observation.  Benefits include possible symptom relief, prevention of incarceration, strangulation, enlargement in size over time, and the risk of emergency surgery in the face of strangulation.  ? ?Typical post-op recovery time of 3-5 days with 2 weeks of activity restrictions were also discussed. ? ?ED return precautions given for sudden increase in pain, size of hernia with accompanying fever, nausea, and/or vomiting. ? ?The patient verbalized understanding and all questions were answered to the patient's satisfaction. ? ? ?2. Patient has elected to proceed with surgical treatment. Procedure will be scheduled. LEFT, POSSIBLE RIGHT, robotic assisted laparoscopic ? ?labs/images/medications/previous chart entries reviewed personally and relevant changes/updates noted above. ? ? ? ?

## 2021-08-13 NOTE — H&P (Signed)
Subjective: ? ?CC: Non-recurrent unilateral inguinal hernia without obstruction or gangrene [K40.90] ? ?HPI: ? Curtis Bonilla is a 55 y.o. male who was referred by Vishwanath Handattur Ha* for evaluation of above. Symptoms were first noted several years ago. Pain is sharp, radiating from the left groin, to the left testicle.  Associated with nothing, exacerbated by exertion, alleviated by laying down.  Lump is reducible.  ?  ?Past Medical History:  has a past medical history of Hypertension and Intervertebral disc disease. ? ?Past Surgical History:  ?Past Surgical History: ?Procedure Laterality Date ? Lumbar Spinal Surgery  1997 ? disc involvement ? COLONOSCOPY  01/03/2018 ? Tubular adenoma of the colon/Repeat 5yrs/TKT ? TONSILLECTOMY   ? ? ?Family History: family history includes Diverticulitis in his father; Heart failure in his paternal grandfather; High blood pressure (Hypertension) in his paternal grandfather; No Known Problems in his mother; Other in his paternal grandfather; Throat cancer in his maternal uncle. ? ?Social History:  reports that he has quit smoking. His smokeless tobacco use includes chew. He reports current alcohol use. He reports that he does not use drugs. ? ?Current Medications: has a current medication list which includes the following prescription(s): amlodipine, hydrochlorothiazide, lisinopril, and pantoprazole. ? ?Allergies:  ?Allergies as of 08/13/2021 - Reviewed 08/13/2021 ?Allergen Reaction Noted ? Penicillin g Rash 08/22/2013 ? ? ?ROS:  ?A 15 point review of systems was performed and pertinent positives and negatives noted in HPI ?  ?Objective: ?  ? ?BP (!) 135/90   Pulse 78   Ht 182.9 cm (6')   Wt (!) 121.1 kg (267 lb)   BMI 36.21 kg/m?  ? ?Constitutional :  Alert, cooperative, no distress ?Lymphatics/Throat:  Supple, no lymphadenopathy ?Respiratory:  clear to auscultation bilaterally ?Cardiovascular:  regular rate and rhythm ?Gastrointestinal: soft, non-tender; bowel sounds  normal; no masses,  no organomegaly. inguinal hernia noted.  moderate, reducible, no overlying skin changes and LEFT ?Musculoskeletal: Steady gait and movement ?Skin: Cool and moist, NO surgical scars ?Psychiatric: Normal affect, non-agitated, not confused ?  ?  ?LABS:  ?n/a  ? ?RADS: ?n/a ?Assessment: ? ?    ?Non-recurrent unilateral inguinal hernia without obstruction or gangrene [K40.90] ? ?Plan: ? ?  ?1. Non-recurrent unilateral inguinal hernia without obstruction or gangrene [K40.90]   ?Discussed the risk of surgery including recurrence, which can be up to 50% in the case of incisional or complex hernias, possible use of prosthetic materials (mesh) and the increased risk of mesh infxn if used, bleeding, chronic pain, post-op infxn, post-op SBO or ileus, and possible re-operation to address said risks. The risks of general anesthetic, if used, includes MI, CVA, sudden death or even reaction to anesthetic medications also discussed. Alternatives include continued observation.  Benefits include possible symptom relief, prevention of incarceration, strangulation, enlargement in size over time, and the risk of emergency surgery in the face of strangulation.  ? ?Typical post-op recovery time of 3-5 days with 2 weeks of activity restrictions were also discussed. ? ?ED return precautions given for sudden increase in pain, size of hernia with accompanying fever, nausea, and/or vomiting. ? ?The patient verbalized understanding and all questions were answered to the patient's satisfaction. ? ? ?2. Patient has elected to proceed with surgical treatment. Procedure will be scheduled. LEFT, POSSIBLE RIGHT, robotic assisted laparoscopic ? ?labs/images/medications/previous chart entries reviewed personally and relevant changes/updates noted above. ? ? ? ?

## 2021-08-21 ENCOUNTER — Other Ambulatory Visit: Payer: Self-pay

## 2021-08-21 ENCOUNTER — Encounter
Admission: RE | Admit: 2021-08-21 | Discharge: 2021-08-21 | Disposition: A | Discharge status: Home / Self Care | Payer: BC Managed Care – PPO | Source: Ambulatory Visit | Attending: Surgery | Admitting: Surgery

## 2021-08-21 HISTORY — DX: Prediabetes: R73.03

## 2021-08-21 HISTORY — DX: Unilateral inguinal hernia, without obstruction or gangrene, not specified as recurrent: K40.90

## 2021-08-21 NOTE — Patient Instructions (Addendum)
Your procedure is scheduled on: Thursday 08/28/21 ?Report to the Registration Desk on the 1st floor of the [facility]. ?To find out your arrival time, please call [phone] between 1PM - 3PM on: Wednesday 08/27/21 ? ?REMEMBER: ?Instructions that are not followed completely may result in serious medical risk, up to and including death; or upon the discretion of your surgeon and anesthesiologist your surgery may need to be rescheduled. ? ?Do not eat food after midnight the night before surgery.  ?No gum chewing, lozengers or hard candies. ? ?You may however, drink CLEAR liquids up to 2 hours before you are scheduled to arrive for your surgery. Do not drink anything within 2 hours of your scheduled arrival time. ? ?Clear liquids include: ?- water  ?- apple juice without pulp ?- gatorade (not RED colors) ?- black coffee or tea (Do NOT add milk or creamers to the coffee or tea) ?Do NOT drink anything that is not on this list. ? ?TAKE THESE MEDICATIONS THE MORNING OF SURGERY WITH A SIP OF WATER: ?amLODipine (NORVASC) 5 MG tablet ? ? ?pantoprazole (PROTONIX) 40 MG tablet (take one the night before and one on the morning of surgery - helps to prevent nausea after surgery.) ? ?One week prior to surgery: ?Stop Anti-inflammatories (NSAIDS) such as Advil, Aleve, Ibuprofen, Motrin, Naproxen, Naprosyn and Aspirin based products such as Excedrin, Goodys Powder, BC Powder. ? ?Stop ANY OVER THE COUNTER supplements until after surgery. ?You may however, continue to take Tylenol if needed for pain up until the day of surgery. ? ?No Alcohol for 24 hours before or after surgery. ? ?No Smoking including e-cigarettes for 24 hours prior to surgery.  ?No chewable tobacco products for at least 6 hours prior to surgery.  ?No nicotine patches on the day of surgery. ? ?Do not use any "recreational" drugs for at least a week prior to your surgery.  ?Please be advised that the combination of cocaine and anesthesia may have negative outcomes,  up to and including death. ?If you test positive for cocaine, your surgery will be cancelled. ? ?On the morning of surgery brush your teeth with toothpaste and water, you may rinse your mouth with mouthwash if you wish. ?Do not swallow any toothpaste or mouthwash. ? ?Use CHG wipes as directed on instruction sheet. ? ?Do not wear jewelry. ? ?Do not wear lotions, powders, or colognes.  ? ?Do not shave body from the neck down 48 hours prior to surgery just in case you cut yourself which could leave a site for infection.  ?Also, freshly shaved skin may become irritated if using the CHG soap. ? ?Do not bring valuables to the hospital. [facility] is not responsible for any missing/lost belongings or valuables.  ? ?Notify your doctor if there is any change in your medical condition (cold, fever, infection). ? ?Wear comfortable clothing (specific to your surgery type) to the hospital. ? ?When coughing or sneezing, hold a pillow firmly against your incision with both hands. This is called ?splinting.? Doing this helps protect your incision. It also decreases belly discomfort. ? ?If you are being discharged the day of surgery, you will not be allowed to drive home. ?You will need a responsible adult (18 years or older) to drive you home and stay with you that night.  ? ?If you are taking public transportation, you will need to have a responsible adult (18 years or older) with you. ?Please confirm with your physician that it is acceptable to use public transportation.  ? ?  Please call the [facility] at [phone] if you have any questions about these instructions. ? ?Surgery Visitation Policy: ? ?Patients undergoing a surgery or procedure may have two family members or support persons with them as long as the person is not COVID-19 positive or experiencing its symptoms.  ? ?Inpatient Visitation:   ? ?Visiting hours are 7 a.m. to 8 p.m. ?Up to four visitors are allowed at one time in a patient room,  including children. The visitors may rotate out with other people during the day. One designated support person (adult) may remain overnight. ? ?All Areas: ?All visitors must pass COVID-19 screenings, use hand sanitizer when entering and exiting the patient?s room and wear a mask at all times, including in the patient?s room. ?Patients must also wear a mask when staff or their visitor are in the room. ?Masking is required regardless of vaccination status.  ?

## 2021-08-22 ENCOUNTER — Encounter
Admission: RE | Admit: 2021-08-22 | Discharge: 2021-08-22 | Disposition: A | Discharge status: Home / Self Care | Payer: BC Managed Care – PPO | Source: Ambulatory Visit | Attending: Surgery | Admitting: Surgery

## 2021-08-22 DIAGNOSIS — Z01810 Encounter for preprocedural cardiovascular examination: Secondary | ICD-10-CM | POA: Insufficient documentation

## 2021-08-27 MED ORDER — CEFAZOLIN SODIUM-DEXTROSE 2-4 GM/100ML-% IV SOLN
2.0000 g | INTRAVENOUS | Status: AC
Start: 2021-08-28 — End: 2021-08-28
  Administered 2021-08-28: 2 g via INTRAVENOUS

## 2021-08-27 MED ORDER — ORAL CARE MOUTH RINSE
15.0000 mL | Freq: Once | OROMUCOSAL | Status: AC
Start: 2021-08-27 — End: 2021-08-28

## 2021-08-27 MED ORDER — GABAPENTIN 300 MG PO CAPS: 300.0000 mg | ORAL_CAPSULE | ORAL | Status: AC

## 2021-08-27 MED ORDER — ACETAMINOPHEN 500 MG PO TABS: 1000.0000 mg | ORAL_TABLET | ORAL | Status: AC

## 2021-08-27 MED ORDER — CHLORHEXIDINE GLUCONATE 0.12 % MT SOLN: 15.0000 mL | Freq: Once | OROMUCOSAL | Status: AC

## 2021-08-27 MED ORDER — CHLORHEXIDINE GLUCONATE CLOTH 2 % EX PADS
6.0000 | MEDICATED_PAD | Freq: Once | CUTANEOUS | Status: AC
  Administered 2021-08-28: 6 via TOPICAL

## 2021-08-27 MED ORDER — CELECOXIB 200 MG PO CAPS: 200.0000 mg | ORAL_CAPSULE | ORAL | Status: AC

## 2021-08-27 MED ORDER — LACTATED RINGERS IV SOLN: INTRAVENOUS | Status: DC

## 2021-08-28 ENCOUNTER — Encounter
Admission: RE | Disposition: A | Discharge status: Home / Self Care | Payer: Self-pay | Source: Home / Self Care | Attending: Surgery

## 2021-08-28 ENCOUNTER — Other Ambulatory Visit: Payer: Self-pay

## 2021-08-28 ENCOUNTER — Encounter: Payer: Self-pay | Admitting: Surgery

## 2021-08-28 ENCOUNTER — Ambulatory Visit
Admission: RE | Admit: 2021-08-28 | Discharge: 2021-08-28 | Disposition: A | Discharge status: Home / Self Care | Payer: BC Managed Care – PPO | Attending: Surgery | Admitting: Surgery

## 2021-08-28 ENCOUNTER — Ambulatory Visit: Payer: BC Managed Care – PPO | Admitting: Urgent Care

## 2021-08-28 DIAGNOSIS — K4020 Bilateral inguinal hernia, without obstruction or gangrene, not specified as recurrent: Secondary | ICD-10-CM | POA: Insufficient documentation

## 2021-08-28 DIAGNOSIS — I10 Essential (primary) hypertension: Secondary | ICD-10-CM | POA: Insufficient documentation

## 2021-08-28 HISTORY — PX: INSERTION OF MESH: SHX5868

## 2021-08-28 SURGERY — REPAIR, HERNIA, INGUINAL, BILATERAL, ROBOT-ASSISTED
Anesthesia: General | Site: Groin | Laterality: Bilateral

## 2021-08-28 MED ORDER — OXYCODONE-ACETAMINOPHEN 5-325 MG PO TABS: 1.0000 | ORAL_TABLET | Freq: Three times a day (TID) | ORAL | 0 refills | Status: AC | PRN

## 2021-08-28 MED ORDER — OXYCODONE-ACETAMINOPHEN 5-325 MG PO TABS: 1.0000 | ORAL_TABLET | Freq: Once | ORAL | Status: DC

## 2021-08-28 MED ORDER — GABAPENTIN 300 MG PO CAPS
ORAL_CAPSULE | ORAL | Status: AC
  Administered 2021-08-28: 300 mg via ORAL
  Filled 2021-08-28: qty 1

## 2021-08-28 MED ORDER — PROPOFOL 10 MG/ML IV BOLUS
INTRAVENOUS | Status: AC
  Filled 2021-08-28: qty 40

## 2021-08-28 MED ORDER — SUCCINYLCHOLINE CHLORIDE 200 MG/10ML IV SOSY
PREFILLED_SYRINGE | INTRAVENOUS | Status: DC | PRN
  Administered 2021-08-28: 120 mg via INTRAVENOUS

## 2021-08-28 MED ORDER — SUGAMMADEX SODIUM 200 MG/2ML IV SOLN
INTRAVENOUS | Status: DC | PRN
  Administered 2021-08-28: 200 mg via INTRAVENOUS

## 2021-08-28 MED ORDER — LIDOCAINE HCL (CARDIAC) PF 100 MG/5ML IV SOSY
PREFILLED_SYRINGE | INTRAVENOUS | Status: DC | PRN
  Administered 2021-08-28: 100 mg via INTRAVENOUS

## 2021-08-28 MED ORDER — MIDAZOLAM HCL 2 MG/2ML IJ SOLN
INTRAMUSCULAR | Status: DC | PRN
  Administered 2021-08-28: 2 mg via INTRAVENOUS

## 2021-08-28 MED ORDER — ONDANSETRON HCL 4 MG/2ML IJ SOLN: 4.0000 mg | Freq: Once | INTRAMUSCULAR | Status: DC | PRN

## 2021-08-28 MED ORDER — FENTANYL CITRATE (PF) 100 MCG/2ML IJ SOLN
INTRAMUSCULAR | Status: AC
  Administered 2021-08-28: 25 ug via INTRAVENOUS
  Filled 2021-08-28: qty 2

## 2021-08-28 MED ORDER — DEXAMETHASONE SODIUM PHOSPHATE 10 MG/ML IJ SOLN
INTRAMUSCULAR | Status: AC
  Filled 2021-08-28: qty 1

## 2021-08-28 MED ORDER — ONDANSETRON HCL 4 MG/2ML IJ SOLN
INTRAMUSCULAR | Status: DC | PRN
  Administered 2021-08-28: 4 mg via INTRAVENOUS

## 2021-08-28 MED ORDER — BUPIVACAINE LIPOSOME 1.3 % IJ SUSP
INTRAMUSCULAR | Status: DC | PRN
Start: 2021-08-28 — End: 2021-08-28
  Administered 2021-08-28: 20 mL

## 2021-08-28 MED ORDER — FENTANYL CITRATE (PF) 100 MCG/2ML IJ SOLN
25.0000 ug | INTRAMUSCULAR | Status: DC | PRN
  Administered 2021-08-28 (×3): 25 ug via INTRAVENOUS

## 2021-08-28 MED ORDER — IBUPROFEN 800 MG PO TABS: 800.0000 mg | ORAL_TABLET | Freq: Three times a day (TID) | ORAL | 0 refills | Status: DC | PRN

## 2021-08-28 MED ORDER — ROCURONIUM BROMIDE 100 MG/10ML IV SOLN
INTRAVENOUS | Status: DC | PRN
  Administered 2021-08-28: 30 mg via INTRAVENOUS
  Administered 2021-08-28: 50 mg via INTRAVENOUS

## 2021-08-28 MED ORDER — BUPIVACAINE-EPINEPHRINE 0.5% -1:200000 IJ SOLN
INTRAMUSCULAR | Status: DC | PRN
  Administered 2021-08-28: 15 mL

## 2021-08-28 MED ORDER — OXYCODONE-ACETAMINOPHEN 5-325 MG PO TABS
ORAL_TABLET | ORAL | Status: AC
  Filled 2021-08-28: qty 1

## 2021-08-28 MED ORDER — FENTANYL CITRATE (PF) 100 MCG/2ML IJ SOLN
INTRAMUSCULAR | Status: AC
  Filled 2021-08-28: qty 2

## 2021-08-28 MED ORDER — PHENYLEPHRINE HCL (PRESSORS) 10 MG/ML IV SOLN
INTRAVENOUS | Status: AC
  Filled 2021-08-28: qty 1

## 2021-08-28 MED ORDER — BUPIVACAINE-EPINEPHRINE (PF) 0.5% -1:200000 IJ SOLN
INTRAMUSCULAR | Status: AC
  Filled 2021-08-28: qty 30

## 2021-08-28 MED ORDER — OXYCODONE-ACETAMINOPHEN 5-325 MG PO TABS
ORAL_TABLET | ORAL | Status: AC
Start: 2021-08-28 — End: 2021-08-28
  Administered 2021-08-28: 1
  Filled 2021-08-28: qty 1

## 2021-08-28 MED ORDER — PROPOFOL 10 MG/ML IV BOLUS
INTRAVENOUS | Status: DC | PRN
Start: 2021-08-28 — End: 2021-08-28
  Administered 2021-08-28: 50 mg via INTRAVENOUS
  Administered 2021-08-28: 170 mg via INTRAVENOUS

## 2021-08-28 MED ORDER — BUPIVACAINE LIPOSOME 1.3 % IJ SUSP
INTRAMUSCULAR | Status: AC
  Filled 2021-08-28: qty 20

## 2021-08-28 MED ORDER — MIDAZOLAM HCL 2 MG/2ML IJ SOLN
INTRAMUSCULAR | Status: AC
  Filled 2021-08-28: qty 2

## 2021-08-28 MED ORDER — ROCURONIUM BROMIDE 10 MG/ML (PF) SYRINGE
PREFILLED_SYRINGE | INTRAVENOUS | Status: AC
  Filled 2021-08-28: qty 10

## 2021-08-28 MED ORDER — CHLORHEXIDINE GLUCONATE 0.12 % MT SOLN
OROMUCOSAL | Status: AC
  Administered 2021-08-28: 15 mL via OROMUCOSAL
  Filled 2021-08-28: qty 15

## 2021-08-28 MED ORDER — ONDANSETRON HCL 4 MG/2ML IJ SOLN
INTRAMUSCULAR | Status: AC
  Filled 2021-08-28: qty 2

## 2021-08-28 MED ORDER — ACETAMINOPHEN 500 MG PO TABS
ORAL_TABLET | ORAL | Status: AC
  Administered 2021-08-28: 1000 mg via ORAL
  Filled 2021-08-28: qty 2

## 2021-08-28 MED ORDER — CELECOXIB 200 MG PO CAPS
ORAL_CAPSULE | ORAL | Status: AC
  Administered 2021-08-28: 200 mg via ORAL
  Filled 2021-08-28: qty 1

## 2021-08-28 MED ORDER — CEFAZOLIN SODIUM-DEXTROSE 2-4 GM/100ML-% IV SOLN
INTRAVENOUS | Status: AC
  Filled 2021-08-28: qty 100

## 2021-08-28 MED ORDER — DEXAMETHASONE SODIUM PHOSPHATE 10 MG/ML IJ SOLN
INTRAMUSCULAR | Status: DC | PRN
  Administered 2021-08-28: 6 mg via INTRAVENOUS

## 2021-08-28 MED ORDER — DOCUSATE SODIUM 100 MG PO CAPS: 100.0000 mg | ORAL_CAPSULE | Freq: Two times a day (BID) | ORAL | 0 refills | Status: AC | PRN

## 2021-08-28 MED ORDER — ACETAMINOPHEN 325 MG PO TABS: 650.0000 mg | ORAL_TABLET | Freq: Three times a day (TID) | ORAL | 0 refills | Status: AC | PRN

## 2021-08-28 MED ORDER — FENTANYL CITRATE (PF) 100 MCG/2ML IJ SOLN
INTRAMUSCULAR | Status: DC | PRN
  Administered 2021-08-28 (×4): 50 ug via INTRAVENOUS

## 2021-08-28 MED ORDER — OXYCODONE-ACETAMINOPHEN 5-325 MG PO TABS
1.0000 | ORAL_TABLET | Freq: Four times a day (QID) | ORAL | Status: DC | PRN
  Administered 2021-08-28: 1 via ORAL

## 2021-08-28 MED ORDER — LIDOCAINE HCL (PF) 2 % IJ SOLN
INTRAMUSCULAR | Status: AC
  Filled 2021-08-28: qty 5

## 2021-08-28 SURGICAL SUPPLY — 52 items
ADH SKN CLS APL DERMABOND .7 (GAUZE/BANDAGES/DRESSINGS) ×2
BAG INFUSER PRESSURE 100CC (MISCELLANEOUS) IMPLANT
BLADE SURG SZ11 CARB STEEL (BLADE) ×3 IMPLANT
BNDG GAUZE ELAST 4 BULKY (GAUZE/BANDAGES/DRESSINGS) ×3 IMPLANT
COVER TIP SHEARS 8 DVNC (MISCELLANEOUS) ×2 IMPLANT
COVER TIP SHEARS 8MM DA VINCI (MISCELLANEOUS) ×3
DERMABOND ADVANCED (GAUZE/BANDAGES/DRESSINGS) ×1
DERMABOND ADVANCED .7 DNX12 (GAUZE/BANDAGES/DRESSINGS) ×2 IMPLANT
DRAPE ARM DVNC X/XI (DISPOSABLE) ×6 IMPLANT
DRAPE COLUMN DVNC XI (DISPOSABLE) ×2 IMPLANT
DRAPE DA VINCI XI ARM (DISPOSABLE) ×9
DRAPE DA VINCI XI COLUMN (DISPOSABLE) ×3
ELECT CAUTERY BLADE 6.4 (BLADE) ×1 IMPLANT
ELECT REM PT RETURN 9FT ADLT (ELECTROSURGICAL) ×3
ELECTRODE REM PT RTRN 9FT ADLT (ELECTROSURGICAL) ×2 IMPLANT
GLOVE SURG SYN 6.5 ES PF (GLOVE) ×9 IMPLANT
GLOVE SURG SYN 6.5 PF PI (GLOVE) ×4 IMPLANT
GLOVE SURG UNDER POLY LF SZ7 (GLOVE) ×7 IMPLANT
GOWN STRL REUS W/ TWL LRG LVL3 (GOWN DISPOSABLE) ×6 IMPLANT
GOWN STRL REUS W/TWL LRG LVL3 (GOWN DISPOSABLE) ×9
IRRIGATOR SUCT 8 DISP DVNC XI (IRRIGATION / IRRIGATOR) IMPLANT
IRRIGATOR SUCTION 8MM XI DISP (IRRIGATION / IRRIGATOR)
IV NS 1000ML (IV SOLUTION)
IV NS 1000ML BAXH (IV SOLUTION) IMPLANT
LABEL OR SOLS (LABEL) IMPLANT
MANIFOLD NEPTUNE II (INSTRUMENTS) ×3 IMPLANT
MESH 3DMAX 4X6 LT LRG (Mesh General) ×1 IMPLANT
MESH 3DMAX 4X6 RT LRG (Mesh General) ×1 IMPLANT
MESH 3DMAX MID 4X6 LT LRG (Mesh General) IMPLANT
MESH 3DMAX MID 4X6 RT LRG (Mesh General) IMPLANT
NDL INSUFFLATION 14GA 120MM (NEEDLE) ×2 IMPLANT
NEEDLE HYPO 22GX1.5 SAFETY (NEEDLE) ×3 IMPLANT
NEEDLE INSUFFLATION 14GA 120MM (NEEDLE) ×3 IMPLANT
OBTURATOR OPTICAL STANDARD 8MM (TROCAR) ×3
OBTURATOR OPTICAL STND 8 DVNC (TROCAR) ×2
OBTURATOR OPTICALSTD 8 DVNC (TROCAR) ×2 IMPLANT
PACK LAP CHOLECYSTECTOMY (MISCELLANEOUS) ×3 IMPLANT
PENCIL ELECTRO HAND CTR (MISCELLANEOUS) ×3 IMPLANT
SEAL CANN UNIV 5-8 DVNC XI (MISCELLANEOUS) ×6 IMPLANT
SEAL XI 5MM-8MM UNIVERSAL (MISCELLANEOUS) ×9
SET TUBE SMOKE EVAC HIGH FLOW (TUBING) ×3 IMPLANT
SOLUTION ELECTROLUBE (MISCELLANEOUS) ×3 IMPLANT
SPONGE T-LAP 4X18 ~~LOC~~+RFID (SPONGE) ×1 IMPLANT
SUT MNCRL 4-0 (SUTURE) ×3
SUT MNCRL 4-0 27XMFL (SUTURE) ×2
SUT VIC AB 2-0 SH 27 (SUTURE) ×6
SUT VIC AB 2-0 SH 27XBRD (SUTURE) ×2 IMPLANT
SUT VLOC 90 6 CV-15 VIOLET (SUTURE) ×6 IMPLANT
SUTURE MNCRL 4-0 27XMF (SUTURE) ×2 IMPLANT
SYR 30ML LL (SYRINGE) ×3 IMPLANT
TAPE TRANSPORE STRL 2 31045 (GAUZE/BANDAGES/DRESSINGS) ×2 IMPLANT
WATER STERILE IRR 500ML POUR (IV SOLUTION) ×2 IMPLANT

## 2021-08-28 NOTE — Interval H&P Note (Signed)
No change. OK to proceed.

## 2021-08-28 NOTE — Anesthesia Procedure Notes (Addendum)
Procedure Name: Intubation ?Date/Time: 08/28/2021 8:10 AM ?Performed by: Gilford Raid, CRNA ?Pre-anesthesia Checklist: Patient identified, Patient being monitored, Timeout performed, Emergency Drugs available and Suction available ?Patient Re-evaluated:Patient Re-evaluated prior to induction ?Oxygen Delivery Method: Circle system utilized ?Preoxygenation: Pre-oxygenation with 100% oxygen ?Induction Type: IV induction ?Ventilation: Mask ventilation without difficulty ?Laryngoscope Size: Hyacinth Meeker and 2 ?Grade View: Grade I ?Tube type: Oral ?Tube size: 7.5 mm ?Number of attempts: 1 ?Placement Confirmation: ETT inserted through vocal cords under direct vision, positive ETCO2 and breath sounds checked- equal and bilateral ?Secured at: 23 cm ?Tube secured with: Tape ?Dental Injury: Teeth and Oropharynx as per pre-operative assessment  ? ? ? ? ?

## 2021-08-28 NOTE — Anesthesia Preprocedure Evaluation (Signed)
Anesthesia Evaluation  ?Patient identified by MRN, date of birth, ID band ?Patient awake ? ? ? ?Reviewed: ?Allergy & Precautions, H&P , NPO status , Patient's Chart, lab work & pertinent test results, reviewed documented beta blocker date and time  ? ?Airway ?Mallampati: III ? ?TM Distance: >3 FB ?Neck ROM: full ? ? ? Dental ? ?(+) Teeth Intact ?  ?Pulmonary ?neg pulmonary ROS,  ?  ?Pulmonary exam normal ? ? ? ? ? ? ? Cardiovascular ?Exercise Tolerance: Good ?hypertension, On Medications ?negative cardio ROS ?Normal cardiovascular exam ?Rhythm:regular Rate:Normal ? ? ?  ?Neuro/Psych ?negative neurological ROS ? negative psych ROS  ? GI/Hepatic ?negative GI ROS, Neg liver ROS,   ?Endo/Other  ?negative endocrine ROS ? Renal/GU ?negative Renal ROS  ?negative genitourinary ?  ?Musculoskeletal ? ? Abdominal ?  ?Peds ? Hematology ?negative hematology ROS ?(+)   ?Anesthesia Other Findings ?Past Medical History: ?No date: Diverticular disease ?No date: Hernia, inguinal ?No date: Hypertension ?No date: Pre-diabetes ?Past Surgical History: ?No date: BACK SURGERY ? ? Reproductive/Obstetrics ?negative OB ROS ? ?  ? ? ? ? ? ? ? ? ? ? ? ? ? ?  ?  ? ? ? ? ? ? ? ? ?Anesthesia Physical ?Anesthesia Plan ? ?ASA: 3 ? ?Anesthesia Plan: General ETT  ? ?Post-op Pain Management:   ? ?Induction:  ? ?PONV Risk Score and Plan: 3 ? ?Airway Management Planned:  ? ?Additional Equipment:  ? ?Intra-op Plan:  ? ?Post-operative Plan:  ? ?Informed Consent: I have reviewed the patients History and Physical, chart, labs and discussed the procedure including the risks, benefits and alternatives for the proposed anesthesia with the patient or authorized representative who has indicated his/her understanding and acceptance.  ? ? ? ?Dental Advisory Given ? ?Plan Discussed with: CRNA ? ?Anesthesia Plan Comments:   ? ? ? ? ? ? ?Anesthesia Quick Evaluation ? ?

## 2021-08-28 NOTE — Discharge Instructions (Addendum)
Hernia repair, Care After This sheet gives you information about how to care for yourself after your procedure. Your health care provider may also give you more specific instructions. If you have problems or questions, contact your health care provider. What can I expect after the procedure? After your procedure, it is common to have the following: Pain in your abdomen, especially in the incision areas. You will be given medicine to control the pain. Tiredness. This is a normal part of the recovery process. Your energy level will return to normal over the next several weeks. Changes in your bowel movements, such as constipation or needing to go more often. Talk with your health care provider about how to manage this. Follow these instructions at home: Medicines  tylenol and advil as needed for discomfort.  Please alternate between the two every four hours as needed for pain.    Use narcotics, if prescribed, only when tylenol and motrin is not enough to control pain.  325-650mg every 8hrs to max of 3000mg/24hrs (including the 325mg in every norco dose) for the tylenol.    Advil up to 800mg per dose every 8hrs as needed for pain.   PLEASE RECORD NUMBER OF PILLS TAKEN UNTIL NEXT FOLLOW UP APPT.  THIS WILL HELP DETERMINE HOW READY YOU ARE TO BE RELEASED FROM ANY ACTIVITY RESTRICTIONS Do not drive or use heavy machinery while taking prescription pain medicine. Do not drink alcohol while taking prescription pain medicine.  Incision care    Follow instructions from your health care provider about how to take care of your incision areas. Make sure you: Keep your incisions clean and dry. Wash your hands with soap and water before and after applying medicine to the areas, and before and after changing your bandage (dressing). If soap and water are not available, use hand sanitizer. Change your dressing as told by your health care provider. Leave stitches (sutures), skin glue, or adhesive strips in  place. These skin closures may need to stay in place for 2 weeks or longer. If adhesive strip edges start to loosen and curl up, you may trim the loose edges. Do not remove adhesive strips completely unless your health care provider tells you to do that. Do not wear tight clothing over the incisions. Tight clothing may rub and irritate the incision areas, which may cause the incisions to open. Do not take baths, swim, or use a hot tub until your health care provider approves. OK TO SHOWER IN 24HRS.   Check your incision area every day for signs of infection. Check for: More redness, swelling, or pain. More fluid or blood. Warmth. Pus or a bad smell. Activity Avoid lifting anything that is heavier than 10 lb (4.5 kg) for 2 weeks or until your health care provider says it is okay. No pushing/pulling greater than 30lbs You may resume normal activities as told by your health care provider. Ask your health care provider what activities are safe for you. Take rest breaks during the day as needed. Eating and drinking Follow instructions from your health care provider about what you can eat after surgery. To prevent or treat constipation while you are taking prescription pain medicine, your health care provider may recommend that you: Drink enough fluid to keep your urine clear or pale yellow. Take over-the-counter or prescription medicines. Eat foods that are high in fiber, such as fresh fruits and vegetables, whole grains, and beans. Limit foods that are high in fat and processed sugars, such as fried and   sweet foods. General instructions Ask your health care provider when you will need an appointment to get your sutures or staples removed. Keep all follow-up visits as told by your health care provider. This is important. Contact a health care provider if: You have more redness, swelling, or pain around your incisions. You have more fluid or blood coming from the incisions. Your incisions feel  warm to the touch. You have pus or a bad smell coming from your incisions or your dressing. You have a fever. You have an incision that breaks open (edges not staying together) after sutures or staples have been removed. You develop a rash. You have chest pain or difficulty breathing. You have pain or swelling in your legs. You feel light-headed or you faint. Your abdomen swells (becomes distended). You have nausea or vomiting. You have blood in your stool (feces). This information is not intended to replace advice given to you by your health care provider. Make sure you discuss any questions you have with your health care provider. Document Released: 12/05/2004 Document Revised: 02/04/2018 Document Reviewed: 02/17/2016 Elsevier Interactive Patient Education  2019 [employer]   AMBULATORY SURGERY  DISCHARGE INSTRUCTIONS   The drugs that you were given will stay in your system until tomorrow so for the next 24 hours you should not:  Drive an automobile Make any legal decisions Drink any alcoholic beverage   You may resume regular meals tomorrow.  Today it is better to start with liquids and gradually work up to solid foods.  You may eat anything you prefer, but it is better to start with liquids, then soup and crackers, and gradually work up to solid foods.   Please notify your doctor immediately if you have any unusual bleeding, trouble breathing, redness and pain at the surgery site, drainage, fever, or pain not relieved by medication.    Additional Instructions:        Please contact your physician with any problems or Same Day Surgery at [phone], Monday through Friday 6 am to 4 pm, or [facility] Main number at [phone].  

## 2021-08-28 NOTE — Transfer of Care (Signed)
Immediate Anesthesia Transfer of Care Note ? ?Patient: Curtis Bonilla ? ?Procedure(s) Performed: XI ROBOTIC ASSISTED BILATERAL INGUINAL HERNIA (Bilateral: Groin) ?INSERTION OF MESH ? ?Patient Location: PACU ? ?Anesthesia Type:General ? ?Level of Consciousness: awake, alert  and oriented ? ?Airway & Oxygen Therapy: Patient Spontanous Breathing and Patient connected to face mask oxygen ? ?Post-op Assessment: Report given to RN and Post -op Vital signs reviewed and stable ? ?Post vital signs: Reviewed and stable ? ?Last Vitals:  ?Vitals Value Taken Time  ?BP 121/71 08/28/21 1113  ?Temp    ?Pulse 68 08/28/21 1115  ?Resp 31 08/28/21 1115  ?SpO2 96 % 08/28/21 1115  ?Vitals shown include unvalidated device data. ? ?Last Pain:  ?Vitals:  ? 08/28/21 0710  ?PainSc: 0-No pain  ?   ? ?  ? ?Complications: No notable events documented. ?

## 2021-08-28 NOTE — Op Note (Signed)
Preoperative diagnosis: Left inguinal Hernia. ? ?Postoperative diagnosis: Left initial reducible and right initial reducible inguinal Hernia ? ?Procedure: Robotic assisted laparoscopic bilateral inguinal hernia repair with mesh ? ?Anesthesia: General ? ?Surgeon: Dr. Tonna Boehringer ? ?Wound Classification: Clean ? ?Specimen: none ? ?Complications: None ? ?Estimated Blood Loss: 76mL ? ? ?Indications:  inguinal hernia. Repair was indicated to avoid complications of incarceration, obstruction and pain, and a prosthetic mesh repair was elected.  See H&P for further details. ? ?Findings: ?1. Vas Deferens and cord structures identified and preserved ?2. Bard 3D max medium weight mesh used for repair ?3. Adequate hemostasis achieved ? ?Description of procedure: ?The patient was taken to the operating room. A time-out was completed verifying correct patient, procedure, site, positioning, and implant(s) and/or special equipment prior to beginning this procedure.  Area was prepped and draped in the usual sterile fashion. An incision was marked 20 cm above the pubic tubercle, slightly above the umbilicus  Scrotum wrapped in Kerlix roll. ? ?Veress needle inserted at palmer's point.  Saline drop test noted to be positive with gradual increase in pressure after initiation of gas insufflation.  15 mm of pressure was achieved prior to removing the Veress needle and then placing a 8 mm port via the Optiview technique through the supraumbilical site.  Inspection of the area afterwards noted no injury to the surrounding organs during insertion of the needle and the port.  2 port sites were marked 8 cm to the lateral sides of the initial port, and a 8 mm robotic port was placed on the left side, another 8 mm robotic port on the right side under direct supervision.  Local anesthesia  infused to the preplanned incision sites prior to insertion of the port.  The [employer] platform was then brought into the operative field and docked to the  ports. ? ?Examination of the abdominal cavity noted a bilateral inguinal hernia. ? ?Left side addressed first. Sigmoid colon Stuck within the hernia sac was gently separated from the peritoneum and reduced back into the abdominal cavity.  A peritoneal flap was created approximately 8cm cephalad to the defect by using scissors with electrocautery.  Dissection was carried down towards the pubic tubercle, developing the myopectineal orifice view.  Laterally the flap was carried towards the ASIS.  Very large hernia sac was noted, which carefully dissected away from the adjacent tissues to be fully reduced out of hernia cavity.  Any bleeding was controlled with combination of electrocautery and manual pressure.   ? ?After confirming adequate dissection and the peritoneal reflection completely down and away from the cord structures, a Large Bard 3DMax medium weight mesh was placed within the anterior abdominal wall, secured in place using 2-0 Vicryl on an SH needle immediately above the pubic tubercle.  After noting proper placement of the mesh with the peritoneal reflection deep to it, the previously created peritoneal flap was secured back up to the anterior abdominal wall using running 3-0 V-Lock.  Both needles were then removed out of the abdominal cavity. ? ?Right side addressed next.  A peritoneal flap was created approximately 8cm cephalad to the defect by using scissors with electrocautery.  Dissection was carried down towards the pubic tubercle, developing the myopectineal orifice view.  Laterally the flap was carried towards the ASIS.  Small hernia sac was noted, which carefully dissected away from the adjacent tissues to be fully reduced out of hernia cavity.  Any bleeding was controlled with combination of electrocautery and manual pressure.   ? ?  After confirming adequate dissection and the peritoneal reflection completely down and away from the cord structures, a Large Bard 3DMax medium weight mesh was placed  within the anterior abdominal wall, secured in place using 2-0 Vicryl on an SH needle immediately above the pubic tubercle.  After noting proper placement of the mesh with the peritoneal reflection deep to it, the previously created peritoneal flap was secured back up to the anterior abdominal wall using running 3-0 V-Lock.  Both needles were then removed out of the abdominal cavityXi platform undocked from the ports and removed off of operative field.  exparel infused as ilioinguinal block bilaterally. ? ?Abdomen then desufflated and ports removed. All the skin incisions were then closed with a subcuticular stitch of Monocryl 4-0. Dermabond was applied. The testis was gently pulled down into its anatomic position in the scrotum.  The patient tolerated the procedure well and was taken to the postanesthesia care unit in stable condition. Sponge and instrument count correct at end of procedure. ? ?

## 2021-08-29 ENCOUNTER — Encounter: Payer: Self-pay | Admitting: Surgery

## 2021-09-01 NOTE — Anesthesia Postprocedure Evaluation (Signed)
Anesthesia Post Note ? ?Patient: KYSEAN SWEET ? ?Procedure(s) Performed: XI ROBOTIC ASSISTED BILATERAL INGUINAL HERNIA (Bilateral: Groin) ?INSERTION OF MESH ? ?Patient location during evaluation: PACU ?Anesthesia Type: General ?Level of consciousness: awake and alert ?Pain management: pain level controlled ?Vital Signs Assessment: post-procedure vital signs reviewed and stable ?Respiratory status: spontaneous breathing, nonlabored ventilation, respiratory function stable and patient connected to nasal cannula oxygen ?Cardiovascular status: blood pressure returned to baseline and stable ?Postop Assessment: no apparent nausea or vomiting ?Anesthetic complications: no ? ? ?No notable events documented. ? ? ?Last Vitals:  ?Vitals:  ? 08/28/21 1306 08/28/21 1405  ?BP: 106/70 116/76  ?Pulse: 78 88  ?Resp: 16 18  ?Temp:  (!) 36.2 ?C  ?SpO2: 95% 98%  ?  ?Last Pain:  ?Vitals:  ? 08/28/21 1405  ?TempSrc: Temporal  ?PainSc: 4   ? ? ?  ?  ?  ?  ?  ?  ? ?Yevette Edwards ? ? ? ? ?

## 2023-04-28 ENCOUNTER — Other Ambulatory Visit: Payer: Self-pay | Admitting: Neurosurgery

## 2023-04-28 DIAGNOSIS — M5416 Radiculopathy, lumbar region: Secondary | ICD-10-CM

## 2023-05-05 ENCOUNTER — Ambulatory Visit
Admission: RE | Admit: 2023-05-05 | Discharge: 2023-05-05 | Disposition: A | Discharge status: Home / Self Care | Payer: BC Managed Care – PPO | Source: Ambulatory Visit | Attending: Neurosurgery | Admitting: Neurosurgery

## 2023-05-05 DIAGNOSIS — M5416 Radiculopathy, lumbar region: Secondary | ICD-10-CM | POA: Insufficient documentation

## 2023-08-26 ENCOUNTER — Other Ambulatory Visit: Payer: Self-pay | Admitting: Neurosurgery

## 2023-08-26 DIAGNOSIS — M5416 Radiculopathy, lumbar region: Secondary | ICD-10-CM

## 2023-08-27 ENCOUNTER — Ambulatory Visit: Payer: Self-pay

## 2023-08-27 DIAGNOSIS — D122 Benign neoplasm of ascending colon: Secondary | ICD-10-CM | POA: Diagnosis not present

## 2023-08-27 DIAGNOSIS — D120 Benign neoplasm of cecum: Secondary | ICD-10-CM | POA: Diagnosis not present

## 2023-08-27 DIAGNOSIS — Z860101 Personal history of adenomatous and serrated colon polyps: Secondary | ICD-10-CM | POA: Diagnosis not present

## 2023-08-27 DIAGNOSIS — Z09 Encounter for follow-up examination after completed treatment for conditions other than malignant neoplasm: Secondary | ICD-10-CM | POA: Diagnosis present

## 2023-08-27 DIAGNOSIS — K640 First degree hemorrhoids: Secondary | ICD-10-CM | POA: Diagnosis not present

## 2023-08-27 DIAGNOSIS — K5730 Diverticulosis of large intestine without perforation or abscess without bleeding: Secondary | ICD-10-CM | POA: Diagnosis not present

## 2023-08-30 ENCOUNTER — Ambulatory Visit
Admission: RE | Admit: 2023-08-30 | Discharge: 2023-08-30 | Disposition: A | Discharge status: Home / Self Care | Source: Ambulatory Visit | Attending: Neurosurgery | Admitting: Neurosurgery

## 2023-08-30 DIAGNOSIS — M5416 Radiculopathy, lumbar region: Secondary | ICD-10-CM

## 2023-08-30 MED ORDER — GADOBUTROL 1 MMOL/ML IV SOLN
10.0000 mL | Freq: Once | INTRAVENOUS | Status: AC | PRN
  Administered 2023-08-30: 10 mL via INTRAVENOUS

## 2024-02-25 ENCOUNTER — Other Ambulatory Visit: Payer: Self-pay

## 2024-02-25 ENCOUNTER — Encounter: Payer: Self-pay | Admitting: Neurology

## 2024-02-25 DIAGNOSIS — R202 Paresthesia of skin: Secondary | ICD-10-CM

## 2024-03-30 ENCOUNTER — Ambulatory Visit: Admitting: Neurology

## 2024-03-30 DIAGNOSIS — R202 Paresthesia of skin: Secondary | ICD-10-CM | POA: Diagnosis not present

## 2024-03-30 DIAGNOSIS — M5416 Radiculopathy, lumbar region: Secondary | ICD-10-CM

## 2024-03-30 NOTE — Procedures (Signed)
 [facility]  [address] [employer], [address]  [place], [place] [address] Tel: [phone] Fax: [phone] Test Date:  03/30/2024  Patient: Curtis Bonilla DOB: 1966/01/02 Physician: Tonita Blanch, DO  Sex: Male Height: 6' 0 Ref Phys: Victory Gunnels, MD  ID#: [id number]   Technician:    History: This is a 58 year old man with history of left L4-L5 laminectomy and microdiscectomy x2 referred for evaluation of left lower extremity paresthesias and pain.  NCV & EMG Findings: Electrodiagnostic testing of the right lower extremity and additional studies of the left shows: Bilateral sural and superficial peroneal sensory responses are within normal limits. Bilateral peroneal and tibial motor responses are within normal limits. Bilateral tibial H reflex studies are within normal limits. There is no evidence of active or chronic motor axonal changes affecting any of the tested muscles.  Motor unit configuration and recruitment pattern is within normal limits.  Impression: Active on chronic left L5 radiculopathy, moderate. There is no evidence of a large fiber sensorimotor polyneuropathy affecting the lower extremities.   ___________________________ Tonita Blanch, DO    Nerve Conduction Studies   Stim Site NR Peak (ms) Norm Peak (ms) O-P Amp (V) Norm O-P Amp  Left Sup Peroneal Anti Sensory (Ant Lat Mall)  32 C  12 cm    2.8 <4.6 5.0 >4  Right Sup Peroneal Anti Sensory (Ant Lat Mall)  32 C  12 cm    2.8 <4.6 5.2 >4  Left Sural Anti Sensory (Lat Mall)  32 C  Calf    2.9 <4.6 6.9 >4  Right Sural Anti Sensory (Lat Mall)  32 C  Calf    3.0 <4.6 6.5 >4     Stim Site NR Onset (ms) Norm Onset (ms) O-P Amp (mV) Norm O-P Amp Site1 Site2 Delta-0 (ms) Dist (cm) Vel (m/s) Norm Vel (m/s)  Left Peroneal Motor (Ext Dig Brev)  32 C  Ankle    4.0 <6.0 4.0 >2.5 B Fib Ankle 8.3 39.0 47 >40  B Fib    12.3  3.8  Poplt B Fib 1.8 10.0 56 >40  Poplt    14.1  3.7         Right Peroneal  Motor (Ext Dig Brev)  32 C  Ankle    4.3 <6.0 3.5 >2.5 B Fib Ankle 8.7 41.0 47 >40  B Fib    13.0  2.9  Poplt B Fib 1.9 10.0 53 >40  Poplt    14.9  2.7         Left Tibial Motor (Abd Hall Brev)  32 C  Ankle    3.6 <6.0 8.0 >4 Knee Ankle 10.4 42.0 40 >40  Knee    14.0  6.1         Right Tibial Motor (Abd Hall Brev)  32 C  Ankle    4.5 <6.0 8.0 >4 Knee Ankle 10.3 44.0 43 >40  Knee    14.8  5.8          Electromyography   Side Muscle Ins.Act Fibs Fasc Recrt Amp Dur Poly Activation Comment  Left AntTibialis Nml *1+ Nml *2- *1+ *1+ *1+ Nml N/A  Left Gastroc Nml Nml Nml Nml Nml Nml Nml Nml N/A  Left Flex Dig Long Nml Nml Nml *2- *1+ *1+ *1+ Nml N/A  Left BicepsFemS Nml Nml Nml Nml Nml Nml Nml Nml N/A  Left RectFemoris Nml Nml Nml Nml Nml Nml Nml Nml N/A  Left GluteusMed Nml Nml Nml *2- *1+ *  1+ *1+ Nml N/A  Right AntTibialis Nml Nml Nml Nml Nml Nml Nml Nml N/A  Right Gastroc Nml Nml Nml Nml Nml Nml Nml Nml N/A  Right Flex Dig Long Nml Nml Nml Nml Nml Nml Nml Nml N/A  Right BicepsFemS Nml Nml Nml Nml Nml Nml Nml Nml N/A  Right GluteusMed Nml Nml Nml Nml Nml Nml Nml Nml N/A      Waveforms:

## 2024-03-31 ENCOUNTER — Encounter: Admitting: Neurology

## 2024-04-24 ENCOUNTER — Other Ambulatory Visit: Payer: Self-pay | Admitting: Pain Medicine

## 2024-04-24 DIAGNOSIS — M5414 Radiculopathy, thoracic region: Secondary | ICD-10-CM

## 2024-04-26 ENCOUNTER — Ambulatory Visit
Admission: RE | Admit: 2024-04-26 | Discharge: 2024-04-26 | Disposition: A | Discharge status: Home / Self Care | Source: Ambulatory Visit | Attending: Pain Medicine | Admitting: Pain Medicine

## 2024-04-26 DIAGNOSIS — M5414 Radiculopathy, thoracic region: Secondary | ICD-10-CM | POA: Insufficient documentation

## 2024-05-08 ENCOUNTER — Encounter: Admitting: Neurology

## 2024-06-27 ENCOUNTER — Ambulatory Visit (INDEPENDENT_AMBULATORY_CARE_PROVIDER_SITE_OTHER)

## 2024-06-27 ENCOUNTER — Encounter: Payer: Self-pay | Admitting: Podiatry

## 2024-06-27 ENCOUNTER — Ambulatory Visit: Payer: Self-pay | Admitting: Podiatry

## 2024-06-27 VITALS — Ht 72.0 in | Wt 263.0 lb

## 2024-06-27 DIAGNOSIS — M7751 Other enthesopathy of right foot: Secondary | ICD-10-CM | POA: Diagnosis not present

## 2024-06-27 DIAGNOSIS — M7752 Other enthesopathy of left foot: Secondary | ICD-10-CM

## 2024-06-27 DIAGNOSIS — M5416 Radiculopathy, lumbar region: Secondary | ICD-10-CM

## 2024-06-27 NOTE — Progress Notes (Signed)
" ° °  Chief Complaint  Patient presents with   Foot Pain    Pt is here due to bilateral foot pain, states this has been going on for a long time, states he has back issues and thought that was the reason for the feet pain, had a injury to the left ankle a long time ago that he thinks may have something to do with it, states he is a diabetic, has a burning, tingling, numbness feeling to the feet, states they are always cold.    HPI: 59 y.o. malepresenting as a new patient for evaluation of chronic severe nerve pain to the bilateral feet.  History of lumbar radiculopathy and lumbar pathology.  He experiences pins-and-needles and burning sensation especially at night when he tries to go to bed.  He has tried multiple modalities including lumbar injections, lumbar surgery, oral gabapentin , custom orthotics, there has been no relief.  He is also been through multiple diagnostic tests with no significant answers.  Past Medical History:  Diagnosis Date   Diverticular disease    Hernia, inguinal    Hypertension    Pre-diabetes     Past Surgical History:  Procedure Laterality Date   BACK SURGERY     INSERTION OF MESH  08/28/2021   Procedure: INSERTION OF MESH;  Surgeon: Tye Millet, DO;  Location: [facility] ORS;  Service: General;;    Allergies[1]   Physical Exam: General: The patient is alert and oriented x3 in no acute distress.  Dermatology: Skin is warm, dry and supple bilateral lower extremities.   Vascular: Palpable pedal pulses bilaterally. Capillary refill within normal limits.  No appreciable edema.  No erythema.  Neurological: Paresthesia with light touch diffusely throughout the bilateral feet left greater than the right  Musculoskeletal Exam: No pedal deformities noted.  Patient ambulatory.  Muscle strength 5/5 all compartments  Radiographic Exam B/L feet 06/27/2024:  Normal osseous mineralization. Joint spaces preserved.  No fractures or irregularities noted.  Impression: Normal  exam  Assessment/Plan of Care: 1.  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  Lumbar radiculopathy w/ PSxHx lumbar surgery.  Most recently January 2025  - Patient evaluated.  X-rays reviewed -Agree that the patient's pain is nerve related.  Unfortunately he has tried multiple diagnostic tests and interventional and conservative modalities with no relief including spinal cord stimulator trial, lumbar injections, gabapentin , custom orthotics -Discussed the possibility of Qutenza to alleviate the patient's pain -Referral placed for pain management, Dr. Wallie Sherry.  Would appreciate his second opinion and expertise as to whether or not the patient would be a good candidate for Qutenza -Return to clinic with me PRN     Thresa EMERSON Sar, DPM [facility]  Dr. Thresa EMERSON Sar, DPM    [address] [place], [place] [address]                Office [phone]  Fax [phone]         [1]  Allergies Allergen Reactions   Penicillins Hives   "

## 2024-07-03 ENCOUNTER — Encounter: Payer: Self-pay | Admitting: Student in an Organized Health Care Education/Training Program

## 2024-07-03 ENCOUNTER — Ambulatory Visit: Admitting: Student in an Organized Health Care Education/Training Program

## 2024-07-03 VITALS — BP 135/86 | HR 77 | Temp 97.3°F | Resp 16 | Ht 72.0 in | Wt 264.0 lb

## 2024-07-03 DIAGNOSIS — G894 Chronic pain syndrome: Secondary | ICD-10-CM | POA: Insufficient documentation

## 2024-07-03 DIAGNOSIS — M5416 Radiculopathy, lumbar region: Secondary | ICD-10-CM

## 2024-07-03 DIAGNOSIS — M961 Postlaminectomy syndrome, not elsewhere classified: Secondary | ICD-10-CM | POA: Diagnosis not present

## 2024-07-03 DIAGNOSIS — E1140 Type 2 diabetes mellitus with diabetic neuropathy, unspecified: Secondary | ICD-10-CM | POA: Insufficient documentation

## 2024-07-03 DIAGNOSIS — G8929 Other chronic pain: Secondary | ICD-10-CM | POA: Insufficient documentation
